# Patient Record
Sex: Male | Born: 1960
Health system: Southern US, Community
[De-identification: ages and names within clinical notes are randomized; demographics above are authoritative.]

## PROBLEM LIST (undated history)

## (undated) DIAGNOSIS — G219 Secondary parkinsonism, unspecified: Secondary | ICD-10-CM

## (undated) DIAGNOSIS — K5010 Crohn's disease of large intestine without complications: Secondary | ICD-10-CM

## (undated) DIAGNOSIS — F319 Bipolar disorder, unspecified: Secondary | ICD-10-CM

## (undated) DIAGNOSIS — G4700 Insomnia, unspecified: Secondary | ICD-10-CM

## (undated) DIAGNOSIS — N400 Enlarged prostate without lower urinary tract symptoms: Secondary | ICD-10-CM

## (undated) DIAGNOSIS — F99 Mental disorder, not otherwise specified: Secondary | ICD-10-CM

## (undated) DIAGNOSIS — R159 Full incontinence of feces: Secondary | ICD-10-CM

## (undated) DIAGNOSIS — I10 Essential (primary) hypertension: Secondary | ICD-10-CM

## (undated) DIAGNOSIS — E7800 Pure hypercholesterolemia, unspecified: Secondary | ICD-10-CM

## (undated) DIAGNOSIS — J42 Unspecified chronic bronchitis: Secondary | ICD-10-CM

## (undated) DIAGNOSIS — M810 Age-related osteoporosis without current pathological fracture: Secondary | ICD-10-CM

## (undated) DIAGNOSIS — F79 Unspecified intellectual disabilities: Secondary | ICD-10-CM

## (undated) DIAGNOSIS — F209 Schizophrenia, unspecified: Secondary | ICD-10-CM

## (undated) DIAGNOSIS — K219 Gastro-esophageal reflux disease without esophagitis: Secondary | ICD-10-CM

## (undated) DIAGNOSIS — F4310 Post-traumatic stress disorder, unspecified: Secondary | ICD-10-CM

## (undated) HISTORY — DX: Post-traumatic stress disorder, unspecified: F43.10

## (undated) HISTORY — DX: Benign prostatic hyperplasia without lower urinary tract symptoms: N40.0

## (undated) HISTORY — DX: Unspecified chronic bronchitis: J42

## (undated) HISTORY — DX: Schizophrenia, unspecified: F20.9

## (undated) HISTORY — DX: Insomnia, unspecified: G47.00

## (undated) HISTORY — DX: Age-related osteoporosis without current pathological fracture: M81.0

## (undated) HISTORY — PX: TRANSURETHRAL RESECTION OF PROSTATE: SHX73

## (undated) HISTORY — DX: Bipolar disorder, unspecified: F31.9

## (undated) HISTORY — DX: Full incontinence of feces: R15.9

## (undated) HISTORY — PX: TONSILLECTOMY AND ADENOIDECTOMY: SUR1326

---

## 2011-09-17 ENCOUNTER — Encounter (HOSPITAL_COMMUNITY): Admission: RE | Payer: Self-pay | Source: Ambulatory Visit

## 2011-09-17 ENCOUNTER — Ambulatory Visit (HOSPITAL_COMMUNITY): Admission: RE | Admit: 2011-09-17 | Payer: Self-pay | Source: Ambulatory Visit | Admitting: Oral Surgery

## 2011-09-17 SURGERY — DENTAL RESTORATION/EXTRACTIONS
Anesthesia: General | Laterality: Bilateral

## 2011-10-11 ENCOUNTER — Encounter (HOSPITAL_COMMUNITY): Payer: Self-pay | Admitting: Pharmacy Technician

## 2011-10-21 ENCOUNTER — Encounter (HOSPITAL_COMMUNITY): Payer: Self-pay

## 2011-10-21 ENCOUNTER — Encounter (HOSPITAL_COMMUNITY)
Admission: RE | Admit: 2011-10-21 | Discharge: 2011-10-21 | Disposition: A | Discharge status: Home / Self Care | Payer: Medicare Other | Source: Ambulatory Visit | Attending: Oral Surgery | Admitting: Oral Surgery

## 2011-10-21 ENCOUNTER — Other Ambulatory Visit (HOSPITAL_COMMUNITY): Payer: Self-pay | Admitting: *Deleted

## 2011-10-21 ENCOUNTER — Other Ambulatory Visit: Payer: Self-pay

## 2011-10-21 ENCOUNTER — Encounter (HOSPITAL_COMMUNITY)
Admission: RE | Admit: 2011-10-21 | Discharge: 2011-10-21 | Disposition: A | Discharge status: Home / Self Care | Payer: Medicare Other | Source: Ambulatory Visit | Attending: Anesthesiology | Admitting: Anesthesiology

## 2011-10-21 HISTORY — DX: Secondary parkinsonism, unspecified: G21.9

## 2011-10-21 HISTORY — DX: Pure hypercholesterolemia, unspecified: E78.00

## 2011-10-21 HISTORY — DX: Gastro-esophageal reflux disease without esophagitis: K21.9

## 2011-10-21 HISTORY — DX: Benign prostatic hyperplasia without lower urinary tract symptoms: N40.0

## 2011-10-21 HISTORY — DX: Crohn's disease of large intestine without complications: K50.10

## 2011-10-21 HISTORY — DX: Essential (primary) hypertension: I10

## 2011-10-21 HISTORY — DX: Unspecified intellectual disabilities: F79

## 2011-10-21 HISTORY — DX: Mental disorder, not otherwise specified: F99

## 2011-10-21 LAB — BASIC METABOLIC PANEL
BUN: 16 mg/dL (ref 6–23)
Calcium: 9.3 mg/dL (ref 8.4–10.5)
GFR calc Af Amer: >90 mL/min (ref >90)
GFR calc non Af Amer: >90 mL/min (ref >90)
Glucose, Bld: 81 mg/dL (ref 70–99)
Potassium: 4.2 mEq/L (ref 3.5–5.1)
Sodium: 141 mEq/L (ref 135–145)

## 2011-10-21 LAB — CBC
MCH: 32.3 pg (ref 26.0–34.0)
MCHC: 33.2 g/dL (ref 30.0–36.0)
Platelets: 199 10*3/uL (ref 150–400)

## 2011-10-21 NOTE — H&P (Signed)
HISTORY AND PHYSICAL  William [name] is a 51 y.o. male patient with CC: Painful teeth  No diagnosis found.  Past Medical History  Diagnosis Date  . GERD (gastroesophageal reflux disease)   . Mental disorder     bipolar  . Hypertension   . Mental retardation   . Prostate hypertrophy   . Crohn's colitis   . Asthma   . Hypercholesteremia   . Diabetes mellitus   . Parkinsonism, secondary     No current facility-administered medications for this encounter.   Current Outpatient Prescriptions  Medication Sig Dispense Refill  . aspirin EC 81 MG tablet Take 81 mg by mouth daily.      Marland Kitchen EPINEPHrine (EPI-PEN) 0.3 mg/0.3 mL DEVI Inject 0.3 mg into the muscle once. For allergic reaction to bees      . fluticasone (FLONASE) 50 MCG/ACT nasal spray Place 2 sprays into the nose daily.      Marland Kitchen lithium carbonate 300 MG capsule Take 300 mg by mouth 2 (two) times daily with a meal.      . LORazepam (ATIVAN) 1 MG tablet Take 1 mg by mouth 3 (three) times daily as needed. For agitation or anxiety      . medroxyPROGESTERone (DEPO-PROVERA) 150 MG/ML injection Inject 150 mg into the muscle once a week. On fridays      . metoprolol succinate (TOPROL-XL) 50 MG 24 hr tablet Take 50 mg by mouth daily. Take with or immediately following a meal.      . omeprazole (PRILOSEC) 20 MG capsule Take 20 mg by mouth 2 (two) times daily.      . risperiDONE (RISPERDAL) 1 MG/ML oral solution Take 1 mg by mouth at bedtime.       . Selenium Sulfide (SELSUN BLUE EX) Apply 1 application topically daily. Shampoo once a day      . simvastatin (ZOCOR) 10 MG tablet Take 10 mg by mouth at bedtime.      . Valproic Acid (DEPAKENE) 250 MG/5ML SYRP syrup Take 250 mg by mouth every morning.      . Valproic Acid (DEPAKENE) 250 MG/5ML SYRP syrup Take 1,750 mg by mouth at bedtime.       Allergies  Allergen Reactions  . Bee Anaphylaxis  . Latex Hives  . Penicillins Cross Reactors Hives and Rash  . Sulfa Drugs Cross Reactors Hives and  Rash   Active Problems:  * No active hospital problems. *   Vitals: There were no vitals taken for this visit. Lab results: Results for orders placed during the hospital encounter of 10/21/11 (from the past 24 hour(s))  BASIC METABOLIC PANEL     Status: Normal   Collection Time   10/21/11  1:41 PM      Component Value Range   Sodium 141  135 - 145 (mEq/L)   Potassium 4.2  3.5 - 5.1 (mEq/L)   Chloride 106  96 - 112 (mEq/L)   CO2 24  19 - 32 (mEq/L)   Glucose, Bld 81  70 - 99 (mg/dL)   BUN 16  6 - 23 (mg/dL)   Creatinine, Ser 4.54  0.50 - 1.35 (mg/dL)   Calcium 9.3  8.4 - 09.8 (mg/dL)   GFR calc non Af Amer >90  >90 (mL/min)   GFR calc Af Amer >90  >90 (mL/min)  CBC     Status: Abnormal   Collection Time   10/21/11  1:41 PM      Component Value Range   WBC  10.6 (*) 4.0 - 10.5 (K/uL)   RBC 3.96 (*) 4.22 - 5.81 (MIL/uL)   Hemoglobin 12.8 (*) 13.0 - 17.0 (g/dL)   HCT 78.2 (*) 95.6 - 52.0 (%)   MCV 97.2  78.0 - 100.0 (fL)   MCH 32.3  26.0 - 34.0 (pg)   MCHC 33.2  30.0 - 36.0 (g/dL)   RDW 21.3  08.6 - 57.8 (%)   Platelets 199  150 - 400 (K/uL)   Radiology Results: Dg Chest 2 View  10/21/2011  *RADIOLOGY REPORT*  Clinical Data: Preop tooth extraction  CHEST - 2 VIEW  Comparison: 09/08/2008  Findings: Heart size is normal.  There is no pleural effusion or edema noted.  There is no airspace consolidation identified.  Calcified pulmonary granulomas are identified in both lungs.  IMPRESSION:  1.  No acute cardiopulmonary abnormalities. 2.  Prior granulomatous inflammation/infection.  Original Report Authenticated By: Rosealee Albee, M.D.   General appearance: cooperative and slowed mentation Head: Normocephalic, without obvious abnormality, atraumatic Eyes: conjunctivae/corneas clear. PERRL, EOM's intact. Fundi benign. Ears: normal TM's and external ear canals both ears Nose: Nares normal. Septum midline. Mucosa normal. No drainage or sinus tenderness. Throat: dental caries, impacted  teeth Neck: no adenopathy, no carotid bruit, no JVD, supple, symmetrical, trachea midline and thyroid not enlarged, symmetric, no tenderness/mass/nodules Resp: clear to auscultation bilaterally Cardio: regular rate and rhythm, S1, S2 normal, no murmur, click, rub or gallop GI: soft, non-tender; bowel sounds normal; no masses,  no organomegaly  Assessment:50 YO WM        GERD (gastroesophageal reflux disease)   . Mental disorder     bipolar  . Hypertension   . Mental retardation   . Prostate hypertrophy   . Crohn's colitis   . Asthma   . Hypercholesteremia   . Diabetes mellitus   . Parkinsonism, secondary   With dental caries, impacted teeth #'s 1, 16, 17, 18, 31, 32. Plan: Dental extractions teeth #'s 1, 16, 17, 18, 31, and 32. General anesthesia. Day surgery.   Georgia Lopes 10/21/2011

## 2011-10-21 NOTE — Pre-Procedure Instructions (Signed)
20 William [name]  10/21/2011   Your procedure is scheduled on:10-22-2011 @10 :00 AM  Report to [facility] at 8:00 AM.  Call this number if you have problems the morning of surgery: [phone]   Remember:   Do not eat food:After Midnight.  May have clear liquids: up to 4 Hours before arrival.  Clear liquids include soda, tea, black coffee, apple or grape juice, broth.until 4:00 AM  Take these medicines the morning of surgery with A SIP OF WATER flonase,lithium,ativan if needed,metoprolol*,omeprazole,valproic acid,   Do not wear jewelry, make-up or nail polish.  Do not wear lotions, powders, or perfumes. You may wear deodorant.  Do not shave 48 hours prior to surgery.  Do not bring valuables to the hospital.  Contacts, dentures or bridgework may not be worn into surgery.  Leave suitcase in the car. After surgery it may be brought to your room.  For patients admitted to the hospital, checkout time is 11:00 AM the day of discharge.   Patients discharged the day of surgery will not be allowed to drive home.  Name and phone number of your driver:  Rayne Du  Special Instructions: CHG Shower Use Special Wash: 1/2 bottle night before surgery and 1/2 bottle morning of surgery.   Please read over the following fact sheets that you were given: Pain Booklet, MRSA Information and Surgical Site Infection Prevention

## 2011-10-22 ENCOUNTER — Encounter (HOSPITAL_COMMUNITY): Payer: Self-pay | Admitting: *Deleted

## 2011-10-22 ENCOUNTER — Ambulatory Visit (HOSPITAL_COMMUNITY)
Admission: RE | Admit: 2011-10-22 | Discharge: 2011-10-22 | Disposition: A | Discharge status: Home / Self Care | Payer: Medicare Other | Source: Ambulatory Visit | Attending: Oral Surgery | Admitting: Oral Surgery

## 2011-10-22 ENCOUNTER — Encounter (HOSPITAL_COMMUNITY): Payer: Self-pay | Admitting: Anesthesiology

## 2011-10-22 ENCOUNTER — Ambulatory Visit (HOSPITAL_COMMUNITY): Payer: Medicare Other | Admitting: Anesthesiology

## 2011-10-22 ENCOUNTER — Encounter (HOSPITAL_COMMUNITY)
Admission: RE | Disposition: A | Discharge status: Home / Self Care | Payer: Self-pay | Source: Ambulatory Visit | Attending: Oral Surgery

## 2011-10-22 DIAGNOSIS — J4489 Other specified chronic obstructive pulmonary disease: Secondary | ICD-10-CM | POA: Insufficient documentation

## 2011-10-22 DIAGNOSIS — Z01812 Encounter for preprocedural laboratory examination: Secondary | ICD-10-CM | POA: Insufficient documentation

## 2011-10-22 DIAGNOSIS — J449 Chronic obstructive pulmonary disease, unspecified: Secondary | ICD-10-CM | POA: Insufficient documentation

## 2011-10-22 DIAGNOSIS — I10 Essential (primary) hypertension: Secondary | ICD-10-CM | POA: Insufficient documentation

## 2011-10-22 DIAGNOSIS — E119 Type 2 diabetes mellitus without complications: Secondary | ICD-10-CM | POA: Insufficient documentation

## 2011-10-22 DIAGNOSIS — K029 Dental caries, unspecified: Secondary | ICD-10-CM | POA: Insufficient documentation

## 2011-10-22 DIAGNOSIS — Z01810 Encounter for preprocedural cardiovascular examination: Secondary | ICD-10-CM | POA: Insufficient documentation

## 2011-10-22 DIAGNOSIS — Z01818 Encounter for other preprocedural examination: Secondary | ICD-10-CM | POA: Insufficient documentation

## 2011-10-22 DIAGNOSIS — K219 Gastro-esophageal reflux disease without esophagitis: Secondary | ICD-10-CM | POA: Insufficient documentation

## 2011-10-22 HISTORY — PX: TOOTH EXTRACTION: SHX859

## 2011-10-22 LAB — GLUCOSE, CAPILLARY: Glucose-Capillary: 97 mg/dL (ref 70–99)

## 2011-10-22 SURGERY — DENTAL RESTORATION/EXTRACTIONS
Anesthesia: General | Site: Mouth | Laterality: Bilateral | Wound class: Clean Contaminated

## 2011-10-22 MED ORDER — GLYCOPYRROLATE 0.2 MG/ML IJ SOLN
INTRAMUSCULAR | Status: DC | PRN
  Administered 2011-10-22: .8 mg via INTRAVENOUS

## 2011-10-22 MED ORDER — PROPOFOL 10 MG/ML IV EMUL
INTRAVENOUS | Status: DC | PRN
  Administered 2011-10-22: 200 mg via INTRAVENOUS

## 2011-10-22 MED ORDER — LACTATED RINGERS IV SOLN
INTRAVENOUS | Status: DC | PRN
  Administered 2011-10-22 (×2): via INTRAVENOUS

## 2011-10-22 MED ORDER — FENTANYL CITRATE 0.05 MG/ML IJ SOLN
INTRAMUSCULAR | Status: DC | PRN
  Administered 2011-10-22: 150 ug via INTRAVENOUS

## 2011-10-22 MED ORDER — ROCURONIUM BROMIDE 100 MG/10ML IV SOLN
INTRAVENOUS | Status: DC | PRN
  Administered 2011-10-22: 50 mg via INTRAVENOUS

## 2011-10-22 MED ORDER — LIDOCAINE-EPINEPHRINE 2 %-1:100000 IJ SOLN
INTRAMUSCULAR | Status: DC | PRN
  Administered 2011-10-22: 10 mL

## 2011-10-22 MED ORDER — ONDANSETRON HCL 4 MG/2ML IJ SOLN
INTRAMUSCULAR | Status: DC | PRN
  Administered 2011-10-22: 4 mg via INTRAVENOUS

## 2011-10-22 MED ORDER — PROMETHAZINE HCL 25 MG/ML IJ SOLN: 6.2500 mg | INTRAMUSCULAR | Status: DC | PRN

## 2011-10-22 MED ORDER — MIDAZOLAM HCL 5 MG/5ML IJ SOLN
INTRAMUSCULAR | Status: DC | PRN
  Administered 2011-10-22: 2 mg via INTRAVENOUS

## 2011-10-22 MED ORDER — LACTATED RINGERS IV SOLN
INTRAVENOUS | Status: DC
  Administered 2011-10-22: 10:00:00 via INTRAVENOUS

## 2011-10-22 MED ORDER — HYDROMORPHONE HCL PF 1 MG/ML IJ SOLN
0.2500 mg | INTRAMUSCULAR | Status: DC | PRN
  Administered 2011-10-22: 0.5 mg via INTRAVENOUS

## 2011-10-22 MED ORDER — SODIUM CHLORIDE 0.9 % IR SOLN
Status: DC | PRN
  Administered 2011-10-22: 1000 mL

## 2011-10-22 MED ORDER — MEPERIDINE HCL 25 MG/ML IJ SOLN: 6.2500 mg | INTRAMUSCULAR | Status: DC | PRN

## 2011-10-22 MED ORDER — DEXTROSE 5 % IV SOLN
INTRAVENOUS | Status: AC
  Filled 2011-10-22: qty 50

## 2011-10-22 MED ORDER — CLINDAMYCIN PHOSPHATE 600 MG/4ML IJ SOLN
INTRAMUSCULAR | Status: AC
  Filled 2011-10-22: qty 4

## 2011-10-22 MED ORDER — CLINDAMYCIN PHOSPHATE 600 MG/50ML IV SOLN
INTRAVENOUS | Status: DC | PRN
  Administered 2011-10-22: 600 mg via INTRAVENOUS

## 2011-10-22 MED ORDER — MORPHINE SULFATE 2 MG/ML IJ SOLN: 0.0500 mg/kg | INTRAMUSCULAR | Status: DC | PRN

## 2011-10-22 MED ORDER — NEOSTIGMINE METHYLSULFATE 1 MG/ML IJ SOLN
INTRAMUSCULAR | Status: DC | PRN
  Administered 2011-10-22: 5 mg via INTRAVENOUS

## 2011-10-22 MED ORDER — OXYCODONE-ACETAMINOPHEN 5-325 MG PO TABS: 1.0000 | ORAL_TABLET | ORAL | Status: AC | PRN

## 2011-10-22 SURGICAL SUPPLY — 38 items
BLADE SURG 15 STRL LF DISP TIS (BLADE) IMPLANT
BLADE SURG 15 STRL SS (BLADE)
BUR CROSS CUT (BURR)
BUR CROSS CUT FISSURE 1.6 (BURR) IMPLANT
BUR EGG ELITE 4.0 (BURR) IMPLANT
BUR SRG MED 1.6XXCUT FSSR (BURR) IMPLANT
BUR SURG 4X8 MED (BURR) IMPLANT
BURR SRG MED 1.6XXCUT FSSR (BURR)
BURR SURG 4X8 MED (BURR)
CANISTER SUCTION 2500CC (MISCELLANEOUS) ×2 IMPLANT
CLOTH BEACON ORANGE TIMEOUT ST (SAFETY) ×2 IMPLANT
COVER SURGICAL LIGHT HANDLE (MISCELLANEOUS) ×2 IMPLANT
GAUZE PACKING FOLDED 2  STR (GAUZE/BANDAGES/DRESSINGS) ×1
GAUZE PACKING FOLDED 2 STR (GAUZE/BANDAGES/DRESSINGS) ×1 IMPLANT
GAUZE SPONGE 4X4 16PLY XRAY LF (GAUZE/BANDAGES/DRESSINGS) IMPLANT
GLOVE BIO SURGEON STRL SZ 6.5 (GLOVE) IMPLANT
GLOVE BIO SURGEON STRL SZ7 (GLOVE) IMPLANT
GLOVE BIO SURGEON STRL SZ7.5 (GLOVE) IMPLANT
GLOVE BIOGEL PI IND STRL 7.0 (GLOVE) ×1 IMPLANT
GLOVE BIOGEL PI INDICATOR 7.0 (GLOVE) ×1
GLOVE SS N UNI LF 6.5 STRL (GLOVE) ×4 IMPLANT
GLOVE SS N UNI LF 7.5 STRL (GLOVE) ×2 IMPLANT
GOWN STRL NON-REIN LRG LVL3 (GOWN DISPOSABLE) ×4 IMPLANT
GOWN STRL REIN XL XLG (GOWN DISPOSABLE) ×2 IMPLANT
KIT BASIN OR (CUSTOM PROCEDURE TRAY) ×2 IMPLANT
KIT ROOM TURNOVER OR (KITS) ×2 IMPLANT
NEEDLE 22X1 1/2 (OR ONLY) (NEEDLE) ×2 IMPLANT
NEEDLE BLUNT 16X1.5 OR ONLY (NEEDLE) IMPLANT
NS IRRIG 1000ML POUR BTL (IV SOLUTION) ×2 IMPLANT
PAD ARMBOARD 7.5X6 YLW CONV (MISCELLANEOUS) ×4 IMPLANT
SUT CHROMIC 3 0 PS 2 (SUTURE) ×4 IMPLANT
SYR 50ML SLIP (SYRINGE) IMPLANT
TOWEL OR 17X24 6PK STRL BLUE (TOWEL DISPOSABLE) IMPLANT
TOWEL OR 17X26 10 PK STRL BLUE (TOWEL DISPOSABLE) ×2 IMPLANT
TRAY ENT MC OR (CUSTOM PROCEDURE TRAY) ×2 IMPLANT
TUBING IRRIGATION (MISCELLANEOUS) ×2 IMPLANT
WATER STERILE IRR 1000ML POUR (IV SOLUTION) IMPLANT
YANKAUER SUCT BULB TIP NO VENT (SUCTIONS) ×2 IMPLANT

## 2011-10-22 NOTE — Preoperative (Signed)
Beta Blockers   Reason not to administer Beta Blockers:Not Applicable 

## 2011-10-22 NOTE — H&P (Signed)
H&P documentation  -History and Physical Reviewed  -Patient has been re-examined  -No change in the plan of care  William [name] M  

## 2011-10-22 NOTE — Op Note (Signed)
10/22/2011  10:41 AM  PATIENT:  William [name]  51 y.o. male  PRE-OPERATIVE DIAGNOSIS:  Impacted nonrestorable teeth numberst one, sixteen, seventeen, eighteen, thirty one, and thirty two.  POST-OPERATIVE DIAGNOSIS:  SAME + previously extracted teeth #'s 16, 32  PROCEDURE:  Procedure(s): Extraction teeth 1, 17, 18, 31   SURGEON:  Surgeon(s): Georgia Lopes, DDS  ANESTHESIA:   local and general  EBL:  minimal  DRAINS: none   LOCAL MEDICATIONS USED:  LIDOCAINE 10 CC  SPECIMEN:  No Specimen  COUNTS:  YES  PLAN OF CARE: Discharge to home after PACU  PATIENT DISPOSITION:  PACU - hemodynamically stable.   PROCEDURE DETAILS: Dictation #[id number]  Georgia Lopes, DMD 10/22/2011 10:41 AM

## 2011-10-22 NOTE — Transfer of Care (Signed)
Immediate Anesthesia Transfer of Care Note  Patient: William [name]  Procedure(s) Performed: Procedure(s) (LRB): DENTAL RESTORATION/EXTRACTIONS (Bilateral)  Patient Location: PACU  Anesthesia Type: General  Level of Consciousness: awake and alert   Airway & Oxygen Therapy: Patient Spontanous Breathing and Patient connected to face mask oxygen  Post-op Assessment: Report given to PACU RN  Post vital signs: Reviewed and stable  Complications: No apparent anesthesia complications

## 2011-10-22 NOTE — Anesthesia Postprocedure Evaluation (Signed)
  Anesthesia Post-op Note  Patient: William [name]  Procedure(s) Performed: Procedure(s) (LRB): DENTAL RESTORATION/EXTRACTIONS (Bilateral)  Patient Location: PACU  Anesthesia Type: General  Level of Consciousness: awake  Airway and Oxygen Therapy: Patient Spontanous Breathing  Post-op Pain: mild  Post-op Assessment: Post-op Vital signs reviewed  Post-op Vital Signs: stable  Complications: No apparent anesthesia complications

## 2011-10-22 NOTE — Anesthesia Postprocedure Evaluation (Signed)
  Anesthesia Post-op Note  Patient: William [name]  Procedure(s) Performed: Procedure(s) (LRB): DENTAL RESTORATION/EXTRACTIONS (Bilateral)  Patient Location: PACU  Anesthesia Type: General  Level of Consciousness: awake  Airway and Oxygen Therapy: Patient Spontanous Breathing  Post-op Pain: mild  Post-op Assessment: Post-op Vital signs reviewed  Post-op Vital Signs: stable  Complications: No apparent anesthesia complications 

## 2011-10-22 NOTE — Anesthesia Preprocedure Evaluation (Signed)
Anesthesia Evaluation  Patient identified by MRN, date of birth, ID band Patient awake    Reviewed: Allergy & Precautions, NPO status , Patient's Chart, lab work & pertinent test results  Airway Mallampati: II      Dental   Pulmonary asthma ,          Cardiovascular hypertension,     Neuro/Psych    GI/Hepatic Neg liver ROS, GERD-  ,  Endo/Other    Renal/GU      Musculoskeletal   Abdominal   Peds  Hematology negative hematology ROS (+)   Anesthesia Other Findings   Reproductive/Obstetrics                           Anesthesia Physical Anesthesia Plan  ASA: III  Anesthesia Plan: General   Post-op Pain Management:    Induction:   Airway Management Planned: Nasal ETT  Additional Equipment:   Intra-op Plan:   Post-operative Plan: Extubation in OR  Informed Consent: I have reviewed the patients History and Physical, chart, labs and discussed the procedure including the risks, benefits and alternatives for the proposed anesthesia with the patient or authorized representative who has indicated his/her understanding and acceptance.     Plan Discussed with: CRNA  Anesthesia Plan Comments:         Anesthesia Quick Evaluation

## 2011-10-23 ENCOUNTER — Encounter (HOSPITAL_COMMUNITY): Payer: Self-pay | Admitting: Oral Surgery

## 2011-10-23 NOTE — Op Note (Signed)
William, [name] NO.:  [id number]  MEDICAL RECORD NO.:  [id number]  LOCATION:  [facility]                         FACILITY:  [facility]  PHYSICIAN:  Georgia Lopes, M.D.  DATE OF BIRTH:  1961/07/04  DATE OF PROCEDURE: DATE OF DISCHARGE:  10/22/2011                              OPERATIVE REPORT   PREOPERATIVE DIAGNOSIS:  Nonrestorable teeth, numbers 1, 16, 17, 18, 31, 32.  POSTOPERATIVE DIAGNOSES:  Nonrestorable teeth, numbers 1, 16, 17, 18, 31, 32 plus previously extracted teeth, numbers 16 and 32.  PROCEDURE:  Extraction of teeth, numbers 1, 17,18, and 31.  SURGEON:  Georgia Lopes, MD  ANESTHESIA:  General.  Dr. Katrinka Blazing, attending, nasal intubation.  INDICATIONS FOR PROCEDURE:  William [name] is a 51 year old male who is referred to my office by his general dentist for removal of nonrestorable teeth.  He has significant medical history and complications including COPD, history of seizures, and severe psychiatric disorder.  Because of the patient's perceived inability to tolerate the procedure with local anesthesia, it was recommended that the patient would be anesthetized and intubated for airway protection.  PROCEDURE:  The patient was taken to the operating room and placed on the table in supine position.  General anesthesia was administered intravenously and a nasal endotracheal tube was placed and secured.  The eyes were protected.  The patient was draped for the procedure.  Time- out was performed.  Throat pack was placed after suctioning the pharynx. 2% lidocaine with 1:100,000 epinephrine was infiltrated in an inferior- alveolar block on the right and left side and a buccal and palatal infiltration in the maxilla in the area of teeth numbers 1, 16.  A bite block was placed in the right side of the mouth and a sweetheart retractor was used to retract the tongue.  A 15 blade used to make a full-thickness incision around teeth numbers 17, 18 and overlying  the area where tooth number 16 appeared to be.  The periosteum was reflected with a periosteal elevator.  Bone was removed around teeth numbers 17 and 18, and these teeth were removed with the 301 elevator and dental forceps.  Distal root fracture on tooth number 18 necessitating additional bone removal, but the root tip was removed with the root tip pick.  In the area of number 16, there was healing bone, but no root or root fragment was noted.  Then, the areas were irrigated and closed with 3-0 chromic.  The bite block and sweetheart were replaced to the other side of the mouth, and attention was turned to tooth number 1.  The area was examined and a 15 blade was used to make an incision around tooth number 1 and area of teeth numbers 31 and 32.  A full-thickness incision was created around tooth number 31 with a distal extension.  The periosteum was reflected and the bone was inspected and there was no tooth number 32 present as it apparently had already been previously extracted.  The radiograph, which was available was taken in 2010 showed the extraction mostly there since then.  Then, the bone was removed around teeth numbers 1 and 31 with the handpiece.  Then,  the teeth were elevated and removed with the forceps.  The sockets were curetted, irrigated, and closed with 3-0 chromic.  The oral cavity was then inspected and found to have good hemostasis and closure.  The oral cavity was suctioned.  Throat pack was removed.  The patient was awakened and taken to the recovery room breathing spontaneously in good condition.  EBL:  Minimum.  COMPLICATIONS:  None.  SPECIMENS:  None.     Georgia Lopes, M.D.     SMJ/MEDQ  D:  10/22/2011  T:  10/23/2011  Job:  [id number]

## 2015-01-18 ENCOUNTER — Encounter: Payer: Self-pay | Admitting: *Deleted

## 2015-01-19 ENCOUNTER — Ambulatory Visit (INDEPENDENT_AMBULATORY_CARE_PROVIDER_SITE_OTHER): Payer: Medicare Other | Admitting: Neurology

## 2015-01-19 ENCOUNTER — Encounter: Payer: Self-pay | Admitting: *Deleted

## 2015-01-19 VITALS — BP 125/70 | HR 75 | Ht 72.0 in | Wt 158.6 lb

## 2015-01-19 DIAGNOSIS — R482 Apraxia: Secondary | ICD-10-CM

## 2015-01-19 NOTE — Patient Instructions (Addendum)
I had a long discussion with the patient's caregiver [name] and the patient regarding his difficulty with walking. 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 He may benefit with trial of low-dose Sinemet. Check CT scan of the head for structural brain lesions as well as check lab work for CMP, CBC, TSH, valproic acid levels. Continue ongoing physical therapy for gait and balance. Return for follow-up in 2 months or call earlier if necessary. Fall Prevention and Home Safety Falls cause injuries and can affect all age groups. It is possible to use preventive measures to significantly decrease the likelihood of falls. There are many simple measures which can make your home safer and prevent falls. OUTDOORS  Repair cracks and edges of walkways and driveways.  Remove high doorway thresholds.  Trim shrubbery on the main path into your home.  Have good outside lighting.  Clear walkways of tools, rocks, debris, and clutter.  Check that handrails are not broken and are securely fastened. Both sides of steps should have handrails.  Have leaves, snow, and ice cleared regularly.  Use sand or salt on walkways during winter months.  In the garage, clean up grease or oil spills. BATHROOM  Install night lights.  Install grab bars by the toilet and in the tub and shower.  Use non-skid mats or decals in the tub or shower.  Place a plastic non-slip stool in the shower to sit on, if needed.  Keep floors dry and clean up all water on the floor immediately.  Remove soap buildup in the tub or shower on a regular basis.  Secure bath mats with non-slip, double-sided rug tape.  Remove throw rugs and tripping hazards from the floors. BEDROOMS  Install night lights.  Make sure a bedside light is easy to reach.  Do not use oversized bedding.  Keep a telephone by your bedside.  Have a firm chair with side arms to use for getting dressed.  Remove throw rugs  and tripping hazards from the floor. KITCHEN  Keep handles on pots and pans turned toward the center of the stove. Use back burners when possible.  Clean up spills quickly and allow time for drying.  Avoid walking on wet floors.  Avoid hot utensils and knives.  Position shelves so they are not too high or low.  Place commonly used objects within easy reach.  If necessary, use a sturdy step stool with a grab bar when reaching.  Keep electrical cables out of the way.  Do not use floor polish or wax that makes floors slippery. If you must use wax, use non-skid floor wax.  Remove throw rugs and tripping hazards from the floor. STAIRWAYS  Never leave objects on stairs.  Place handrails on both sides of stairways and use them. Fix any loose handrails. Make sure handrails on both sides of the stairways are as long as the stairs.  Check carpeting to make sure it is firmly attached along stairs. Make repairs to worn or loose carpet promptly.  Avoid placing throw rugs at the top or bottom of stairways, or properly secure the rug with carpet tape to prevent slippage. Get rid of throw rugs, if possible.  Have an electrician put in a light switch at the top and bottom of the stairs. OTHER FALL PREVENTION TIPS  Wear low-heel or rubber-soled shoes that are supportive and fit well. Wear closed toe shoes.  When using a stepladder, make sure it is fully opened and both spreaders are firmly  locked. Do not climb a closed stepladder.  Add color or contrast paint or tape to grab bars and handrails in your home. Place contrasting color strips on first and last steps.  Learn and use mobility aids as needed. Install an electrical emergency response system.  Turn on lights to avoid dark areas. Replace light bulbs that burn out immediately. Get light switches that glow.  Arrange furniture to create clear pathways. Keep furniture in the same place.  Firmly attach carpet with non-skid or  double-sided tape.  Eliminate uneven floor surfaces.  Select a carpet pattern that does not visually hide the edge of steps.  Be aware of all pets. OTHER HOME SAFETY TIPS  Set the water temperature for 120 F (48.8 C).  Keep emergency numbers on or near the telephone.  Keep smoke detectors on every level of the home and near sleeping areas. Document Released: 08/16/2002 Document Revised: 02/25/2012 Document Reviewed: 11/15/2011 [facility] Patient Information 2015 [employer], [place]. This information is not intended to replace advice given to you by your health care provider. Make sure you discuss any questions you have with your health care provider.

## 2015-01-19 NOTE — Progress Notes (Signed)
[facility] [address] [place]. [place] [id number] [phone]       OFFICE CONSULT NOTE  Mr. William [name] Date of Birth:  05/18/1961 Medical Record Number:  [id number]   Referring MD:  Brent BullaLawrence Perry  Reason for Referral:  Parkinson's disease and gait difficulty  HPI: Mr William [name] is a [age] year Caucasian male with lifelong history of mental retardation's, psychosis and behavioral abnormalities. He is unable to provide history which is obtained from his caregiver [place] who accompanies him. He apparently has had long-standing mild shuffling gait difficulties but for the last month or so his walking has declined significantly. There are times when he freezes and is unable to walk. On some occasions he may be able to walk after using tricks like asking him to step over something but on other occasions he may walk sideways but not forwards. The caregiver showed me her cell phone video of the patient shuffling and walking with short steps and all of the sudden he stops walking and goes down to his knees and he is unable to walk even after being prompted to do so. He requires full assist and lives in a foster home and has 24-hour supervision. 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 He is not had any recent falls or injuries. He has been on long term antipsychotics for most of his life but currently he is taking only Risperdal 1 mg at night in addition to the TM and Depakote. He has had some agitation and behavior issues and hallucinations but these seem to be fairly well controlled at the present time. There has been no excessive drooling of saliva noted. He has also had long-standing tremors in his hands but but they are both absent at rest and are likely medication related. ROS:   14 system review of systems is positive for   fatigue, diarrhea, constipation, urination problems, incontinence, cramps, allergies, runny nose, confusion, and numbness, slurred speech, insomnia, sleepiness, tremor, anxiety, appetite change, disinterest in activities, hallucinations.  PMH:  Past Medical History  Diagnosis Date  . GERD (gastroesophageal reflux disease)   . Mental disorder     bipolar  . Hypertension   . Mental retardation   . Prostate hypertrophy   . Crohn's colitis   . Asthma   . Hypercholesteremia   . Diabetes mellitus     Type II  . Parkinsonism, secondary   . Schizophrenia, chronic condition   . Osteoporosis   . Incontinence of feces   . Bipolar affective disorder     chronic  . BPH (benign prostatic hyperplasia)   . Chronic bronchitis   . Insomnia   . PTSD (post-traumatic stress disorder)     Social History:  History   Social History  . Marital Status: Single    Spouse Name: N/A  . Number of Children: N/A  . Years of Education: N/A   Occupational History  . disabled    Social History Main Topics  . Smoking status: Never Smoker   . Smokeless tobacco: Not on file  . Alcohol Use: No  . Drug Use: No  . Sexual Activity: Not on file   Other Topics Concern  . Not on file   Social History Narrative   Single, disabled, lives in home with full time caregiver- [name]   Right handed  caffeine use - none    Medications:   Current Outpatient Prescriptions on File Prior to Visit  Medication Sig Dispense Refill  . aspirin EC 81 MG tablet Take 81 mg by mouth daily.    Marland Kitchen. EPINEPHrine (EPI-PEN) 0.3 mg/0.3 mL DEVI Inject 0.3 mg into the muscle once. For allergic reaction to bees    . fluticasone (FLONASE) 50 MCG/ACT nasal spray Place 2 sprays into the nose daily.    Marland Kitchen. lithium carbonate 300 MG capsule Take 300 mg by mouth 2 (two) times daily with a meal.    . LORazepam (ATIVAN) 1 MG tablet Take 1 mg by mouth 3 (three) times daily as needed. For agitation or anxiety    . medroxyPROGESTERone  (DEPO-PROVERA) 150 MG/ML injection Inject 150 mg into the muscle once a week. On fridays    . metoprolol succinate (TOPROL-XL) 50 MG 24 hr tablet Take 50 mg by mouth daily. Take with or immediately following a meal.    . omeprazole (PRILOSEC) 20 MG capsule Take 20 mg by mouth 2 (two) times daily.    . risperiDONE (RISPERDAL) 1 MG/ML oral solution Take 1 mg by mouth at bedtime.     . Selenium Sulfide (SELSUN BLUE EX) Apply 1 application topically daily. Shampoo once a day    . simvastatin (ZOCOR) 10 MG tablet Take 10 mg by mouth at bedtime.    . Valproic Acid (DEPAKENE) 250 MG/5ML SYRP syrup Take 250 mg by mouth every morning.    . Valproic Acid (DEPAKENE) 250 MG/5ML SYRP syrup Take 1,750 mg by mouth at bedtime.     No current facility-administered medications on file prior to visit.    Allergies:   Allergies  Allergen Reactions  . Bee Venom Anaphylaxis  . Nutritional Supplements Anaphylaxis    Care giver unsure if this is correct  . Latex Hives  . Penicillins Cross Reactors Hives and Rash  . Sulfa Drugs Cross Reactors Hives and Rash    Physical Exam General: Frail cachectic looking middle-age Caucasian male, seated, in no evident distress Head: head normocephalic and atraumatic.   Neck: supple with no carotid or supraclavicular bruits Cardiovascular: regular rate and rhythm, no murmurs Musculoskeletal: no deformity Skin:  no rash/petichiae Vascular:  Normal pulses all extremities  Neurologic Exam Mental Status: Awake and fully alert. Oriented to place and person only. Recent and remote memory poor. Attention span, concentration and fund of knowledge diminished Mood and affect appropriate. Mini-Mental status exam score 14/30 with deficits in orientation, attention, calculation and recall and following 3 step commands. Animal naming test 5 only. Unable to copy intersecting pentagons or draw a clock. Geriatric depression scale score 5 which is borderline for depression. Speech is  pressured and at times dysarthric and difficult to understand but can speak fluent sentences if he can slow down Cranial Nerves: Fundoscopic exam reveals sharp disc margins. Pupils equal, briskly reactive to light. Extraocular movements full without nystagmus. Visual fields full to confrontation. Hearing intact. Facial sensation intact. Face, tongue, palate moves normally and symmetrically.  Motor: Normal bulk and tone. Normal strength in all tested extremity muscles. No visible orofacial dyskinesias. No resting tremor but mild fine action tremor of outstretched upper extremities. Mild cogwheel rigidity at both wrists upon activation only. Able to get up from the chair with arms folded across his chest. Sensory.: intact to touch , pinprick , position and vibratory sensation.  Coordination: Rapid alternating movements normal in all extremities. Finger-to-nose and heel-to-shin performed accurately bilaterally. Gait and Station:  Arises from chair without difficulty. Stance is normal. Gait short steps with initial shuffling and initiation apraxia and some festination but no stooped posture. Able to heel, toe and tandem walk without difficulty.  Reflexes: 1+ and symmetric. Toes downgoing.      ASSESSMENT: 56 year Caucasian male with a lifelong history of mental retardation and behavioral psychosis with drug-induced Parkinsonism with recent worsening of gait with freezing episodes and gait apraxia. Bilateral upper extremity tremor likely multifactorial from drug effect    PLAN: I had a long discussion with the patient's caregiver [place] and the patient regarding his difficulty with walking. 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 He may benefit with trial of low-dose Sinemet. Check CT scan of the head for structural brain lesions as well as check lab work for CMP, CBC, TSH, valproic acid levels. Continue ongoing physical therapy for gait and balance. Return for  follow-up in 2 months or call earlier if necessary.  Delia Heady, MD Note: This document was prepared with digital dictation and possible smart phrase technology. Any transcriptional errors that result from this process are unintentional.

## 2015-01-20 ENCOUNTER — Telehealth: Payer: Self-pay | Admitting: *Deleted

## 2015-01-20 LAB — COMPREHENSIVE METABOLIC PANEL
ALK PHOS: 34 IU/L — AB (ref 39–117)
ALT: 22 IU/L (ref 0–44)
AST: 17 IU/L (ref 0–40)
Albumin/Globulin Ratio: 1.8 (ref 1.1–2.5)
Albumin: 4.1 g/dL (ref 3.5–5.5)
BUN / CREAT RATIO: 16 (ref 9–20)
BUN: 15 mg/dL (ref 6–24)
Bilirubin Total: 0.4 mg/dL (ref 0.0–1.2)
CALCIUM: 8.7 mg/dL (ref 8.7–10.2)
CHLORIDE: 106 mmol/L (ref 97–108)
CO2: 19 mmol/L (ref 18–29)
CREATININE: 0.93 mg/dL (ref 0.76–1.27)
GFR calc Af Amer: 108 mL/min/{1.73_m2} (ref >59)
GFR, EST NON AFRICAN AMERICAN: 93 mL/min/{1.73_m2} (ref >59)
Globulin, Total: 2.3 g/dL (ref 1.5–4.5)
Glucose: 84 mg/dL (ref 65–99)
Potassium: 4.2 mmol/L (ref 3.5–5.2)
Sodium: 143 mmol/L (ref 134–144)
TOTAL PROTEIN: 6.4 g/dL (ref 6.0–8.5)

## 2015-01-20 LAB — VALPROIC ACID LEVEL: Valproic Acid Lvl: 72 ug/mL (ref 50–100)

## 2015-01-20 LAB — CBC
Hematocrit: 37.2 % — ABNORMAL LOW (ref 37.5–51.0)
Hemoglobin: 12 g/dL — ABNORMAL LOW (ref 12.6–17.7)
MCH: 32 pg (ref 26.6–33.0)
MCHC: 32.3 g/dL (ref 31.5–35.7)
MCV: 99 fL — ABNORMAL HIGH (ref 79–97)
PLATELETS: 178 10*3/uL (ref 150–379)
RBC: 3.75 x10E6/uL — ABNORMAL LOW (ref 4.14–5.80)
RDW: 14.1 % (ref 12.3–15.4)
WBC: 5.6 10*3/uL (ref 3.4–10.8)

## 2015-01-20 LAB — TSH: TSH: 2.6 u[IU]/mL (ref 0.450–4.500)

## 2015-01-20 NOTE — Telephone Encounter (Signed)
-----   Message from Micki RileyPramod S Sethi, MD sent at 01/20/2015  6:20 AM EDT ----- Kindly inform patient`s caregiver that all labs unremarkable and valproic acid level is normal also

## 2015-01-20 NOTE — Telephone Encounter (Signed)
Per Dr Pearlean BrownieSethi, spoke to [place], patient's caregiver and informed her that all his labs are "unremarkable". Confirmed with her his 2 month FU date/time. She requested lab results be faxed to her; will fax today.

## 2015-01-23 ENCOUNTER — Telehealth: Payer: Self-pay | Admitting: Neurology

## 2015-01-23 NOTE — Telephone Encounter (Signed)
lvm to give mri appointment to patient caregiver.  [facility] is 11:30 at [facility] arrive at 11:00am.  [phone] [id number] [facility]

## 2015-01-23 NOTE — Telephone Encounter (Signed)
Patient's caregiver [name] is calling and states that the order for patient to have catskan was [facility] but they would like to have the test at [facility].  Thanks!

## 2015-02-01 ENCOUNTER — Telehealth: Payer: Self-pay | Admitting: Neurology

## 2015-02-01 NOTE — Telephone Encounter (Signed)
William PonsHolly Garren a caregiver for the patient is calling to get CT results for the patient.

## 2015-02-01 NOTE — Telephone Encounter (Signed)
Per Dr Pearlean BrownieSethi, spoke with [facility] and gave her MRI results. She requests the results be faxed to her;

## 2015-02-01 NOTE — Telephone Encounter (Signed)
Tried to reach [place], patient's caregiver to inquire if [facility] has faxed MRI results to this office. Unable to leave message for her.

## 2015-02-02 NOTE — Telephone Encounter (Signed)
Yes Looked at it y`day and d/w you

## 2015-04-12 ENCOUNTER — Encounter: Payer: Self-pay | Admitting: Neurology

## 2015-04-12 ENCOUNTER — Ambulatory Visit (INDEPENDENT_AMBULATORY_CARE_PROVIDER_SITE_OTHER): Payer: Medicare Other | Admitting: Neurology

## 2015-04-12 VITALS — BP 123/77 | HR 58 | Ht 72.0 in | Wt 156.8 lb

## 2015-04-12 DIAGNOSIS — R269 Unspecified abnormalities of gait and mobility: Secondary | ICD-10-CM | POA: Diagnosis not present

## 2015-04-12 NOTE — Progress Notes (Signed)
[facility] [address] [place]. [place] [address] [phone]       OFFICE CONSULT NOTE  Mr. AZHAR KNOPE Date of Birth:  1961-05-04 Medical Record Number:  [id number]   Referring MD:  Brent Bulla  Reason for Referral:  Parkinson's disease and gait difficulty  HPI: Mr Pasha is a 39 year Caucasian male with lifelong history of mental retardation's, psychosis and behavioral abnormalities. He is unable to provide history which is obtained from his caregiver [place] who accompanies him. He apparently has had long-standing mild shuffling gait difficulties but for the last month or so his walking has declined significantly. There are times when he freezes and is unable to walk. On some occasions he may be able to walk after using tricks like asking him to step over something but on other occasions he may walk sideways but not forwards. The caregiver showed me her cell phone video of the patient shuffling and walking with short steps and all of the sudden he stops walking and goes down to his knees and he is unable to walk even after being prompted to do so. He requires full assist and lives in a foster home and has 24-hour supervision. 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 He has been on long term antipsychotics for most of his life but currently he is taking only Risperdal 1 mg at night in addition to the TM and Depakote. He has had some agitation and behavior issues and hallucinations but these seem to be fairly well controlled at the present time. There has been no excessive drooling of saliva noted. He has also had long-standing tremors in his hands but but they are both absent at rest and are likely medication related.   Update 04/12/2015 : He returns for follow-up after  last visit 3 months ago. He is accompanied by his caregiver [place]. She feels that the patient has noticed improvement in his walking and freezing after starting Sinemet which is included tolerating well in the present dose of 25/100 one tablet 3 times daily. She feels that the times his gait difficulties is attention seeking behavior as at times he can walk fairly well and at times he has bizarre walking which taking of his legs. Patient was recently hospitalized with acute psychotic episode of month ago. He had a CT scan of the head which had ordered at last visit at [facility] on 01/24/15 which showed mild generalized atrophy and no acute findings. Lab work done on 01/19/15 which I reviewed showed normal TSH, complete metabolic panel and CBC. Patient continues to live in a foster home and his medications and supervising given to him. His tremor seemed to be improved and he is on Sinemet and he has discontinued Artane. Review of Systems : Positive for runny nose, insomnia, frequent waking, muscle cramps, walking difficulty, numbness, agitation, near vomiting, decreased concentration, anxiety, nervousness, and 7 additions and self injury. All other systems negative PMH:  Past Medical History  Diagnosis Date  . GERD (gastroesophageal reflux disease)   . Mental disorder     bipolar  . Hypertension   . Mental retardation   . Prostate hypertrophy   . Crohn's colitis   . Asthma   . Hypercholesteremia   . Diabetes mellitus  Type II  . Parkinsonism, secondary   . Schizophrenia, chronic condition   . Osteoporosis   . Incontinence of feces   . Bipolar affective disorder     chronic  . BPH (benign prostatic hyperplasia)   . Chronic bronchitis   . Insomnia   . PTSD (post-traumatic stress disorder)     Social History:  History   Social History  . Marital Status: Single    Spouse Name: N/A  . Number of Children: N/A  . Years of Education: N/A   Occupational History  . disabled     Social History Main Topics  . Smoking status: Never Smoker   . Smokeless tobacco: Not on file  . Alcohol Use: No  . Drug Use: No  . Sexual Activity: Not on file   Other Topics Concern  . Not on file   Social History Narrative   Single, disabled, lives in home with full time caregiver- [name]   Right handed   caffeine use - none    Medications:   Current Outpatient Prescriptions on File Prior to Visit  Medication Sig Dispense Refill  . alendronate (FOSAMAX) 70 MG tablet     . aspirin EC 81 MG tablet Take 81 mg by mouth daily.    . diphenhydrAMINE (BENADRYL) 12.5 MG/5ML elixir Take 12.5 mg by mouth 3 (three) times daily.    Marland Kitchen EPINEPHrine (EPI-PEN) 0.3 mg/0.3 mL DEVI Inject 0.3 mg into the muscle once. For allergic reaction to bees    . fluticasone (FLONASE) 50 MCG/ACT nasal spray Place 2 sprays into the nose daily.    Marland Kitchen lithium carbonate 300 MG capsule Take 300 mg by mouth 3 (three) times daily.     Marland Kitchen LORazepam (ATIVAN) 1 MG tablet Take 1 mg by mouth 3 (three) times daily as needed. For agitation or anxiety    . medroxyPROGESTERone (DEPO-PROVERA) 150 MG/ML injection Inject 150 mg into the muscle once a week. On fridays    . metoprolol succinate (TOPROL-XL) 50 MG 24 hr tablet Take 50 mg by mouth daily. Take with or immediately following a meal.    . omeprazole (PRILOSEC) 20 MG capsule Take 20 mg by mouth 2 (two) times daily.    . ranitidine (ZANTAC) 150 MG tablet     . risperiDONE (RISPERDAL) 1 MG/ML oral solution Take 1 mg by mouth at bedtime.     . Selenium Sulfide (SELSUN BLUE EX) Apply 1 application topically daily. Shampoo once a day    . simvastatin (ZOCOR) 10 MG tablet Take 10 mg by mouth at bedtime.    . Valproic Acid (DEPAKENE) 250 MG/5ML SYRP syrup Take 250 mg by mouth every morning.    . Valproic Acid (DEPAKENE) 250 MG/5ML SYRP syrup Take 1,750 mg by mouth at bedtime.     No current facility-administered medications on file prior to visit.    Allergies:   Allergies   Allergen Reactions  . Bee Venom Anaphylaxis  . Nutritional Supplements Anaphylaxis    Care giver unsure if this is correct  . Latex Hives  . Penicillins Cross Reactors Hives and Rash  . Sulfa Drugs Cross Reactors Hives and Rash    Physical Exam General: Frail cachectic looking middle-age Caucasian male, seated, in no evident distress Head: head normocephalic and atraumatic.   Neck: supple with no carotid or supraclavicular bruits Cardiovascular: regular rate and rhythm, no murmurs Musculoskeletal: no deformity Skin:  no rash/petichiae Vascular:  Normal pulses all extremities  Neurologic Exam Mental Status: Awake and  fully alert. Oriented to place and person only. Recent and remote memory poor. Attention span, concentration and fund of knowledge diminished Mood and affect appropriate. Mini-Mental status exam not done   following 3 step commands. Speech is pressured  can speak fluent sentences if he can slow down Cranial Nerves: Fundoscopic exam reveals sharp disc margins. Pupils equal, briskly reactive to light. Extraocular movements full without nystagmus. Visual fields full to confrontation. Hearing intact. Facial sensation intact. Face, tongue, palate moves normally and symmetrically.  Motor: Normal bulk and tone. Normal strength in all tested extremity muscles. No visible orofacial dyskinesias. No resting tremor but mild fine action tremor of outstretched upper extremities. Mild cogwheel rigidity at both wrists upon activation only. Able to get up from the chair with arms folded across his chest. Sensory.: intact to touch , pinprick , position and vibratory sensation.  Coordination: Rapid alternating movements normal in all extremities. Finger-to-nose and heel-to-shin performed accurately bilaterally. Gait and Station: Arises from chair without difficulty. Stance is normal. Gait short steps with initial shuffling and initiation apraxia and some festination but no stooped posture. Able to  heel, toe and tandem walk without difficulty.  Reflexes: 1+ and symmetric. Toes downgoing.      ASSESSMENT: 28 year Caucasian male with a lifelong history of mental retardation and behavioral psychosis with drug-induced Parkinsonism with recent worsening of gait with freezing episodes and gait apraxia improved on sinemet. Bilateral upper extremity tremor likely multifactorial from drug effect now improved    PLAN: I had a long discussion with the patient and caregiver [facility] regarding his tremors parkinsonian symptoms and gait abnormality and answered questions. Continue Sinemet in the current dose of 25/100 one tablet 3 times daily. I'm reluctant to increase it given his recent episode of psychosis and being on multiple psychoactive medications. I advised the patient to get up slowly and walk carefully and avoid making sudden jerky body movements. No further neurological testing is indicated at the present time. He was advised to return for follow-up in 6 months with Butch Penny, NP or call earlier if necessary    Delia Heady, MD Note: This document was prepared with digital dictation and possible smart phrase technology. Any transcriptional errors that result from this process are unintentional.

## 2015-04-12 NOTE — Patient Instructions (Signed)
I had a long discussion with the patient and caregiver [facility] regarding his tremors parkinsonian symptoms and gait abnormality and answered questions. Continue Sinemet in the current dose of 25/100 one tablet 3 times daily. I'm reluctant to increase it given his recent episode of psychosis and being on multiple psychoactive medications. I advised the patient to get up slowly and walk carefully and avoid making sudden jerky body movements. No further neurological testing is indicated at the present time. He was advised to return for follow-up in 6 months with Butch Penny, NP or call earlier if necessary

## 2015-04-13 ENCOUNTER — Ambulatory Visit: Payer: Medicare Other | Admitting: Neurology

## 2015-10-16 ENCOUNTER — Ambulatory Visit: Payer: Medicare Other | Admitting: Adult Health

## 2016-07-29 DIAGNOSIS — L603 Nail dystrophy: Secondary | ICD-10-CM | POA: Insufficient documentation

## 2017-01-27 DIAGNOSIS — S91119A Laceration without foreign body of unspecified toe without damage to nail, initial encounter: Secondary | ICD-10-CM | POA: Insufficient documentation

## 2017-03-05 ENCOUNTER — Emergency Department (HOSPITAL_COMMUNITY): Payer: Medicare Other

## 2017-03-05 ENCOUNTER — Inpatient Hospital Stay (HOSPITAL_COMMUNITY)
Admission: EM | Admit: 2017-03-05 | Discharge: 2017-03-10 | DRG: 641 | Disposition: A | Discharge status: Home Health | Payer: Medicare Other | Attending: Internal Medicine | Admitting: Internal Medicine

## 2017-03-05 ENCOUNTER — Encounter (HOSPITAL_COMMUNITY): Payer: Self-pay | Admitting: Internal Medicine

## 2017-03-05 ENCOUNTER — Observation Stay (HOSPITAL_COMMUNITY): Payer: Medicare Other

## 2017-03-05 DIAGNOSIS — R296 Repeated falls: Secondary | ICD-10-CM

## 2017-03-05 DIAGNOSIS — F319 Bipolar disorder, unspecified: Secondary | ICD-10-CM | POA: Diagnosis present

## 2017-03-05 DIAGNOSIS — Z882 Allergy status to sulfonamides status: Secondary | ICD-10-CM

## 2017-03-05 DIAGNOSIS — D649 Anemia, unspecified: Secondary | ICD-10-CM

## 2017-03-05 DIAGNOSIS — Z681 Body mass index (BMI) 19 or less, adult: Secondary | ICD-10-CM

## 2017-03-05 DIAGNOSIS — K219 Gastro-esophageal reflux disease without esophagitis: Secondary | ICD-10-CM | POA: Diagnosis present

## 2017-03-05 DIAGNOSIS — G4700 Insomnia, unspecified: Secondary | ICD-10-CM | POA: Diagnosis present

## 2017-03-05 DIAGNOSIS — W19XXXD Unspecified fall, subsequent encounter: Secondary | ICD-10-CM | POA: Diagnosis not present

## 2017-03-05 DIAGNOSIS — N179 Acute kidney failure, unspecified: Secondary | ICD-10-CM | POA: Insufficient documentation

## 2017-03-05 DIAGNOSIS — E870 Hyperosmolality and hypernatremia: Secondary | ICD-10-CM | POA: Diagnosis not present

## 2017-03-05 DIAGNOSIS — R451 Restlessness and agitation: Secondary | ICD-10-CM

## 2017-03-05 DIAGNOSIS — I10 Essential (primary) hypertension: Secondary | ICD-10-CM | POA: Diagnosis present

## 2017-03-05 DIAGNOSIS — Z880 Allergy status to penicillin: Secondary | ICD-10-CM

## 2017-03-05 DIAGNOSIS — F79 Unspecified intellectual disabilities: Secondary | ICD-10-CM | POA: Diagnosis present

## 2017-03-05 DIAGNOSIS — N400 Enlarged prostate without lower urinary tract symptoms: Secondary | ICD-10-CM

## 2017-03-05 DIAGNOSIS — E119 Type 2 diabetes mellitus without complications: Secondary | ICD-10-CM | POA: Diagnosis present

## 2017-03-05 DIAGNOSIS — Z7983 Long term (current) use of bisphosphonates: Secondary | ICD-10-CM

## 2017-03-05 DIAGNOSIS — M810 Age-related osteoporosis without current pathological fracture: Secondary | ICD-10-CM | POA: Diagnosis present

## 2017-03-05 DIAGNOSIS — R627 Adult failure to thrive: Secondary | ICD-10-CM | POA: Diagnosis present

## 2017-03-05 DIAGNOSIS — G219 Secondary parkinsonism, unspecified: Secondary | ICD-10-CM

## 2017-03-05 DIAGNOSIS — Z888 Allergy status to other drugs, medicaments and biological substances status: Secondary | ICD-10-CM

## 2017-03-05 DIAGNOSIS — Z8249 Family history of ischemic heart disease and other diseases of the circulatory system: Secondary | ICD-10-CM

## 2017-03-05 DIAGNOSIS — Z79899 Other long term (current) drug therapy: Secondary | ICD-10-CM

## 2017-03-05 DIAGNOSIS — J42 Unspecified chronic bronchitis: Secondary | ICD-10-CM | POA: Diagnosis present

## 2017-03-05 DIAGNOSIS — N251 Nephrogenic diabetes insipidus: Secondary | ICD-10-CM | POA: Diagnosis present

## 2017-03-05 DIAGNOSIS — R4189 Other symptoms and signs involving cognitive functions and awareness: Secondary | ICD-10-CM | POA: Diagnosis present

## 2017-03-05 DIAGNOSIS — W19XXXA Unspecified fall, initial encounter: Secondary | ICD-10-CM

## 2017-03-05 DIAGNOSIS — R001 Bradycardia, unspecified: Secondary | ICD-10-CM | POA: Diagnosis present

## 2017-03-05 DIAGNOSIS — F209 Schizophrenia, unspecified: Secondary | ICD-10-CM

## 2017-03-05 DIAGNOSIS — I480 Paroxysmal atrial fibrillation: Secondary | ICD-10-CM | POA: Diagnosis present

## 2017-03-05 DIAGNOSIS — Z7982 Long term (current) use of aspirin: Secondary | ICD-10-CM

## 2017-03-05 DIAGNOSIS — F4310 Post-traumatic stress disorder, unspecified: Secondary | ICD-10-CM | POA: Diagnosis present

## 2017-03-05 DIAGNOSIS — Z91030 Bee allergy status: Secondary | ICD-10-CM

## 2017-03-05 DIAGNOSIS — D539 Nutritional anemia, unspecified: Secondary | ICD-10-CM | POA: Diagnosis present

## 2017-03-05 DIAGNOSIS — Z91040 Latex allergy status: Secondary | ICD-10-CM

## 2017-03-05 LAB — HEMOGLOBIN AND HEMATOCRIT, BLOOD
HCT: 26.8 % — ABNORMAL LOW (ref 39.0–52.0)
Hemoglobin: 8.2 g/dL — ABNORMAL LOW (ref 13.0–17.0)

## 2017-03-05 LAB — CBC WITH DIFFERENTIAL/PLATELET
Basophils Absolute: 0 10*3/uL (ref 0.0–0.1)
Basophils Relative: 0 %
EOS PCT: 2 %
Eosinophils Absolute: 0.2 10*3/uL (ref 0.0–0.7)
HEMATOCRIT: 25.8 % — AB (ref 39.0–52.0)
Hemoglobin: 8 g/dL — ABNORMAL LOW (ref 13.0–17.0)
LYMPHS ABS: 1.5 10*3/uL (ref 0.7–4.0)
Lymphocytes Relative: 19 %
MCH: 34 pg (ref 26.0–34.0)
MCHC: 31 g/dL (ref 30.0–36.0)
MCV: 109.8 fL — AB (ref 78.0–100.0)
Monocytes Absolute: 1.1 10*3/uL — ABNORMAL HIGH (ref 0.1–1.0)
Monocytes Relative: 13 %
NEUTROS ABS: 5.2 10*3/uL (ref 1.7–7.7)
Neutrophils Relative %: 66 %
PLATELETS: 141 10*3/uL — AB (ref 150–400)
RBC: 2.35 MIL/uL — AB (ref 4.22–5.81)
RDW: 17.2 % — ABNORMAL HIGH (ref 11.5–15.5)
WBC: 7.9 10*3/uL (ref 4.0–10.5)

## 2017-03-05 LAB — COMPREHENSIVE METABOLIC PANEL
ALBUMIN: 2.9 g/dL — AB (ref 3.5–5.0)
ALT: 39 U/L (ref 17–63)
ANION GAP: 5 (ref 5–15)
AST: 35 U/L (ref 15–41)
Alkaline Phosphatase: 79 U/L (ref 38–126)
BUN: 30 mg/dL — AB (ref 6–20)
CHLORIDE: 127 mmol/L — AB (ref 101–111)
CO2: 26 mmol/L (ref 22–32)
Calcium: 8.1 mg/dL — ABNORMAL LOW (ref 8.9–10.3)
Creatinine, Ser: 1.39 mg/dL — ABNORMAL HIGH (ref 0.61–1.24)
GFR calc Af Amer: >60 mL/min (ref >60)
GFR, EST NON AFRICAN AMERICAN: 56 mL/min — AB (ref >60)
GLUCOSE: 88 mg/dL (ref 65–99)
POTASSIUM: 4 mmol/L (ref 3.5–5.1)
Sodium: 158 mmol/L — ABNORMAL HIGH (ref 135–145)
Total Bilirubin: 0.6 mg/dL (ref 0.3–1.2)
Total Protein: 5.8 g/dL — ABNORMAL LOW (ref 6.5–8.1)

## 2017-03-05 LAB — URINALYSIS, ROUTINE W REFLEX MICROSCOPIC
Bilirubin Urine: NEGATIVE
GLUCOSE, UA: NEGATIVE mg/dL
HGB URINE DIPSTICK: NEGATIVE
Ketones, ur: NEGATIVE mg/dL
LEUKOCYTES UA: NEGATIVE
Nitrite: NEGATIVE
PH: 8 (ref 5.0–8.0)
PROTEIN: NEGATIVE mg/dL
SPECIFIC GRAVITY, URINE: 1.01 (ref 1.005–1.030)

## 2017-03-05 LAB — BASIC METABOLIC PANEL
ANION GAP: 4 — AB (ref 5–15)
BUN: 25 mg/dL — ABNORMAL HIGH (ref 6–20)
CHLORIDE: 126 mmol/L — AB (ref 101–111)
CO2: 27 mmol/L (ref 22–32)
CREATININE: 1.28 mg/dL — AB (ref 0.61–1.24)
Calcium: 8.1 mg/dL — ABNORMAL LOW (ref 8.9–10.3)
GFR calc non Af Amer: >60 mL/min (ref >60)
Glucose, Bld: 111 mg/dL — ABNORMAL HIGH (ref 65–99)
Potassium: 4 mmol/L (ref 3.5–5.1)
SODIUM: 157 mmol/L — AB (ref 135–145)

## 2017-03-05 LAB — RETICULOCYTES
RBC.: 2.45 MIL/uL — ABNORMAL LOW (ref 4.22–5.81)
Retic Count, Absolute: 73.5 10*3/uL (ref 19.0–186.0)
Retic Ct Pct: 3 % (ref 0.4–3.1)

## 2017-03-05 LAB — AMMONIA: AMMONIA: 20 umol/L (ref 9–35)

## 2017-03-05 LAB — VALPROIC ACID LEVEL: Valproic Acid Lvl: 67 ug/mL (ref 50.0–100.0)

## 2017-03-05 LAB — LITHIUM LEVEL: Lithium Lvl: 0.85 mmol/L (ref 0.60–1.20)

## 2017-03-05 MED ORDER — CLONAZEPAM 0.125 MG PO TBDP
0.2500 mg | ORAL_TABLET | Freq: Two times a day (BID) | ORAL | Status: DC
  Administered 2017-03-06 – 2017-03-10 (×10): 0.25 mg via ORAL
  Filled 2017-03-05 (×10): qty 2

## 2017-03-05 MED ORDER — TAMSULOSIN HCL 0.4 MG PO CAPS
0.4000 mg | ORAL_CAPSULE | Freq: Every day | ORAL | Status: DC
  Administered 2017-03-06 – 2017-03-10 (×5): 0.4 mg via ORAL
  Filled 2017-03-05 (×5): qty 1

## 2017-03-05 MED ORDER — DEXTROSE-NACL 5-0.45 % IV SOLN
INTRAVENOUS | Status: DC
  Administered 2017-03-05 – 2017-03-06 (×2): via INTRAVENOUS

## 2017-03-05 MED ORDER — PANTOPRAZOLE SODIUM 40 MG PO TBEC
40.0000 mg | DELAYED_RELEASE_TABLET | Freq: Every day | ORAL | Status: DC
  Administered 2017-03-06 – 2017-03-10 (×5): 40 mg via ORAL
  Filled 2017-03-05 (×5): qty 1

## 2017-03-05 MED ORDER — ORAL CARE MOUTH RINSE
15.0000 mL | Freq: Two times a day (BID) | OROMUCOSAL | Status: DC
  Administered 2017-03-06 – 2017-03-09 (×8): 15 mL via OROMUCOSAL

## 2017-03-05 MED ORDER — ACETAMINOPHEN 650 MG RE SUPP: 650.0000 mg | Freq: Four times a day (QID) | RECTAL | Status: DC | PRN

## 2017-03-05 MED ORDER — CLONAZEPAM 0.5 MG PO TABS: 0.5000 mg | ORAL_TABLET | Freq: Two times a day (BID) | ORAL | Status: DC | PRN

## 2017-03-05 MED ORDER — SODIUM CHLORIDE 0.9 % IV BOLUS (SEPSIS)
500.0000 mL | Freq: Once | INTRAVENOUS | Status: AC
  Administered 2017-03-05: 500 mL via INTRAVENOUS

## 2017-03-05 MED ORDER — ASPIRIN EC 81 MG PO TBEC
81.0000 mg | DELAYED_RELEASE_TABLET | Freq: Every day | ORAL | Status: DC
Start: 2017-03-06 — End: 2017-03-10
  Administered 2017-03-06 – 2017-03-10 (×5): 81 mg via ORAL
  Filled 2017-03-05 (×5): qty 1

## 2017-03-05 MED ORDER — LITHIUM CARBONATE 300 MG PO CAPS
300.0000 mg | ORAL_CAPSULE | Freq: Every day | ORAL | Status: DC
  Administered 2017-03-05: 300 mg via ORAL
  Filled 2017-03-05: qty 1

## 2017-03-05 MED ORDER — VALPROATE SODIUM 250 MG/5ML PO SOLN
750.0000 mg | Freq: Every day | ORAL | Status: DC
  Administered 2017-03-06 – 2017-03-10 (×5): 750 mg via ORAL
  Filled 2017-03-05 (×5): qty 15

## 2017-03-05 MED ORDER — OLANZAPINE 5 MG PO TBDP
5.0000 mg | ORAL_TABLET | Freq: Every day | ORAL | Status: DC
  Administered 2017-03-05 – 2017-03-09 (×5): 5 mg via ORAL
  Filled 2017-03-05 (×4): qty 1

## 2017-03-05 MED ORDER — ONDANSETRON HCL 4 MG/2ML IJ SOLN: 4.0000 mg | Freq: Four times a day (QID) | INTRAMUSCULAR | Status: DC | PRN

## 2017-03-05 MED ORDER — ONDANSETRON HCL 4 MG PO TABS: 4.0000 mg | ORAL_TABLET | Freq: Four times a day (QID) | ORAL | Status: DC | PRN

## 2017-03-05 MED ORDER — MEDROXYPROGESTERONE ACETATE 150 MG/ML IM SUSP
150.0000 mg | INTRAMUSCULAR | Status: DC
  Administered 2017-03-07: 150 mg via INTRAMUSCULAR
  Filled 2017-03-05: qty 1

## 2017-03-05 MED ORDER — OLANZAPINE 5 MG PO TBDP
5.0000 mg | ORAL_TABLET | Freq: Two times a day (BID) | ORAL | Status: DC | PRN
  Filled 2017-03-05: qty 1

## 2017-03-05 MED ORDER — TRAMADOL HCL 50 MG PO TABS: 50.0000 mg | ORAL_TABLET | Freq: Three times a day (TID) | ORAL | Status: DC | PRN

## 2017-03-05 MED ORDER — ACETAMINOPHEN 325 MG PO TABS: 650.0000 mg | ORAL_TABLET | Freq: Four times a day (QID) | ORAL | Status: DC | PRN

## 2017-03-05 MED ORDER — FAMOTIDINE 20 MG PO TABS
20.0000 mg | ORAL_TABLET | Freq: Two times a day (BID) | ORAL | Status: DC
  Administered 2017-03-05 – 2017-03-10 (×10): 20 mg via ORAL
  Filled 2017-03-05 (×10): qty 1

## 2017-03-05 MED ORDER — MAGNESIUM HYDROXIDE 400 MG/5ML PO SUSP: 30.0000 mL | Freq: Every day | ORAL | Status: DC | PRN

## 2017-03-05 MED ORDER — CARBIDOPA-LEVODOPA 25-100 MG PO TABS
1.0000 | ORAL_TABLET | Freq: Three times a day (TID) | ORAL | Status: DC
  Administered 2017-03-05 – 2017-03-10 (×14): 1 via ORAL
  Filled 2017-03-05 (×15): qty 1

## 2017-03-05 MED ORDER — VALPROATE SODIUM 250 MG/5ML PO SOLN
1250.0000 mg | Freq: Every day | ORAL | Status: DC
  Administered 2017-03-05 – 2017-03-09 (×5): 1250 mg via ORAL
  Filled 2017-03-05 (×5): qty 25

## 2017-03-05 MED ORDER — SIMVASTATIN 10 MG PO TABS
10.0000 mg | ORAL_TABLET | Freq: Every day | ORAL | Status: DC
  Administered 2017-03-05 – 2017-03-09 (×5): 10 mg via ORAL
  Filled 2017-03-05 (×5): qty 1
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
## 2017-03-05 NOTE — ED Notes (Signed)
Pt. Documented in error DG Chest 2 view. 

## 2017-03-05 NOTE — ED Provider Notes (Signed)
[facility]-EMERGENCY DEPT Provider Note   CSN: [id number] Arrival date & time: 03/05/17  1539     History   Chief Complaint No chief complaint on file.   HPI William [name] is a 56 y.o. male.  The history is provided by a caregiver. No language interpreter was used.    William [name] is a 56 y.o. male who presents to the Emergency Department complaining of AMS.  Level V caveat due to AMS.  History is provided by the patient's caregivers. They report that he has a history of MR and schizophrenia and has episodes of combative behavior. Over the last several weeks he had worsening episodes of combative behavior with multiple falls and striking his head on the ground. He has been seen by his psychiatrist and had labs drawn a week ago but they were resulted out yesterday and noted an elevation in his creatinine. He was told to hold his lithium due to elevation in his creatinine and his psychiatrist wanted him evaluated for possible medical causes of his symptoms. No fevers, vomiting, diarrhea. Caregiver states that his speech is more difficult to understand over the last several weeks compared to previously.  Past Medical History:  Diagnosis Date  . Asthma   . Bipolar affective disorder    chronic  . BPH (benign prostatic hyperplasia)   . Chronic bronchitis   . Crohn's colitis   . Diabetes mellitus    Type II  . GERD (gastroesophageal reflux disease)   . Hypercholesteremia   . Hypertension   . Incontinence of feces   . Insomnia   . Mental disorder    bipolar  . Mental retardation   . Osteoporosis   . Parkinsonism, secondary   . Prostate hypertrophy   . PTSD (post-traumatic stress disorder)   . Schizophrenia, chronic condition     There are no active problems to display for this patient.   Past Surgical History:  Procedure Laterality Date  . TONSILLECTOMY AND ADENOIDECTOMY     as child  . TOOTH EXTRACTION  10/22/2011   Procedure: DENTAL RESTORATION/EXTRACTIONS;  Surgeon:  Georgia Lopes, DDS;  Location: [facility] OR;  Service: Oral Surgery;  Laterality: Bilateral;  . TRANSURETHRAL RESECTION OF PROSTATE     x 2       Home Medications    Prior to Admission medications   Medication Sig Start Date End Date Taking? Authorizing Provider  alendronate (FOSAMAX) 70 MG tablet  01/12/15   [provider]  alfuzosin (UROXATRAL) 10 MG 24 hr tablet  04/10/15   [provider]  aspirin EC 81 MG tablet Take 81 mg by mouth daily.    [provider]  carbidopa-levodopa (SINEMET IR) 25-100 MG per tablet  04/10/15   [provider]  diphenhydrAMINE (BENADRYL) 12.5 MG/5ML elixir Take 12.5 mg by mouth 3 (three) times daily.    [provider]  EPINEPHrine (EPI-PEN) 0.3 mg/0.3 mL DEVI Inject 0.3 mg into the muscle once. For allergic reaction to bees    [provider]  fluticasone (FLONASE) 50 MCG/ACT nasal spray Place 2 sprays into the nose daily.    [provider]  lithium carbonate 300 MG capsule Take 300 mg by mouth 3 (three) times daily.     [provider]  LORazepam (ATIVAN) 1 MG tablet Take 1 mg by mouth 3 (three) times daily as needed. For agitation or anxiety    [provider]  medroxyPROGESTERone (DEPO-PROVERA) 150 MG/ML injection Inject 150 mg into the  muscle once a week. On fridays    [provider]  metoprolol succinate (TOPROL-XL) 50 MG 24 hr tablet Take 50 mg by mouth daily. Take with or immediately following a meal.    [provider]  mirtazapine (REMERON) 7.5 MG tablet  04/05/15   [provider]  omeprazole (PRILOSEC) 20 MG capsule Take 20 mg by mouth 2 (two) times daily.    [provider]  ranitidine (ZANTAC) 150 MG tablet  01/08/15   [provider]  risperiDONE (RISPERDAL) 1 MG/ML oral solution Take 1 mg by mouth at bedtime.     [provider]  Selenium Sulfide (SELSUN BLUE EX) Apply 1 application topically daily. Shampoo once a day     [provider]  simvastatin (ZOCOR) 10 MG tablet Take 10 mg by mouth at bedtime.    [provider]  Valproic Acid (DEPAKENE) 250 MG/5ML SYRP syrup Take 250 mg by mouth every morning.    [provider]  Valproic Acid (DEPAKENE) 250 MG/5ML SYRP syrup Take 1,750 mg by mouth at bedtime.    [provider]    Family History Family History  Problem Relation Age of Onset  . Diabetes Father   . Heart disease Father   . Diabetes Mother   . Heart disease Mother   . Dementia Mother     Social History Social History  Substance Use Topics  . Smoking status: Never Smoker  . Smokeless tobacco: Not on file  . Alcohol use No     Allergies   Bee venom; Nutritional supplements; Latex; Penicillins cross reactors; and Sulfa drugs cross reactors   Review of Systems Review of Systems  All other systems reviewed and are negative.    Physical Exam Updated Vital Signs BP 125/77 (BP Location: Left Arm)   Pulse 87   Temp 98.7 F (37.1 C) (Oral)   Resp 18   SpO2 100%   Physical Exam  Constitutional: He appears well-developed and well-nourished.  Sitting on side of bed with hunched posture.  HENT:  Head: Normocephalic.  Swelling and tenderness to the left forehead. Mild right periorbital ecchymosis.  Cardiovascular: Normal rate and regular rhythm.   No murmur heard. Pulmonary/Chest: Effort normal and breath sounds normal. No respiratory distress.  Abdominal: Soft. There is no tenderness. There is no rebound and no guarding.  Musculoskeletal: He exhibits no edema or tenderness.  Mild tenderness to palpation over the left hip.  Neurological:  Appears sleepy but conversant. Dysarthric speech. Difficulty in understanding and following simple commands. 4 out of 5 strength in all 4 extremities.  Skin: Skin is warm and dry.  Nursing note and vitals reviewed.    ED Treatments / Results  Labs (all labs ordered are listed, but only abnormal results are  displayed) Labs Reviewed  COMPREHENSIVE METABOLIC PANEL  CBC WITH DIFFERENTIAL/PLATELET  URINALYSIS, ROUTINE W REFLEX MICROSCOPIC  LITHIUM LEVEL  VALPROIC ACID LEVEL  AMMONIA    EKG  EKG Interpretation None       Radiology No results found.  Procedures Procedures (including critical care time)  Medications Ordered in ED Medications - No data to display   Initial Impression / Assessment and Plan / ED Course  I have reviewed the triage vital signs and the nursing notes.  Pertinent labs & imaging results that were available during my care of the patient were reviewed by me and considered in my medical decision making (see chart for details).    Patient here for evaluation  of increased agitation and combative behavior. Outpatient labs for a week ago demonstrated mild elevation in serum creatinine 1.28. Today he has worsening in his creatinine with hypernatremia. Given rapid change in his sodium plan to admit for further treatment. Hospitalist consulted for admission.  Per group home - do not use ativan due to severe agitation.    Group home also reports 2LNC oxygen at night.     Final Clinical Impressions(s) / ED Diagnoses   Final diagnoses:  Acute hypernatremia    New Prescriptions New Prescriptions   No medications on file     Tilden Fossa, MD 03/05/17 2330

## 2017-03-05 NOTE — ED Triage Notes (Addendum)
Pt caregiver pt is experiencing a schizophrenic relapse. Pt has been having odd behavior for approx 3-4 weeks. Pt strips his clothes off in public, lays in the woods with no clothes on, hits himself, very combative, having hallucinations, and not taking care of himself. Pt hasn't been sleeping.

## 2017-03-05 NOTE — Progress Notes (Signed)
[name] from the pt's foster care home reported the following recent medication changes to the CSW.  [name] reported recent medication changes: 5/31 Stopped ambient and toprol XL 6/11: Increased zyprexa PRN 6/15:Lithium and Klonopin were to start today. Pt also now has Klonopin PRN, per [name] at  [employer] [employer] [employer]  [place]olly reports she has the medical records for the pt if they are needed.    [name] also reported, "Do not give him Ativan it has an adverse reaction and becomes violent and belligerent".  [name] also reported pt began 2 liters of oxygen last month due to an overnight study saying his SAT was at 40 at night.   CSW will [name] on the night of 6/27.  Dorothe PeaJonathan F. Nester Bachus, Francesco SorLCSWA, LCAS, CSI Clinical Social Worker Ph: [phone]

## 2017-03-05 NOTE — Progress Notes (Signed)
Consult request has been received. CSW attempting to follow up at present time.  Rook Maue F. Elie Gragert, LCSWA, LCAS Clinical Social Worker Ph: [phone]  

## 2017-03-05 NOTE — H&P (Signed)
History and Physical    William [name]:[id number] DOB: Nov 24, 1960 DOA: 03/05/2017  PCP: Abigail Miyamoto, MD   Patient coming from: Home  Chief Complaint: Told to report to the ED for evaluation of AKI  HPI: William [name] is a 56 y.o. gentleman with a history of cognitive impairment, schizophrenia, Bipolar disorder, HTN, BPH, secondary Parkinsonism, and self-destructive behaviors who was brought to the ED tonight by caregivers for follow-up of abnormal labs.  Caregivers have documented several weeks to months of combative, aggressive, and self-destructive behaviors including hitting, kicking, and banging his head against the wall.  He also intermittently refuses to eat and has been caught "pocketing" his medications in his mouth.  He has also had recurrent falls.  He is under the care of a psychiatrist as an outpatient.  He was sent for labs at the beginning of June which showed a sodium level of 148, creatinine 1.28, Hgb 10.  Labs were repeated on Friday and reportedly showed creatinine of 1.5 and sodium greater than 150.  Lithium taper was recommended and the patient was also advised to reported to the ED for evaluation of hypernatremia and AKI (baseline creatinine normal in this system two years ago).  Per his caregiver, he has been eating and drinking well when he is compliant.  No known LOC or seizure activity.  He has had recent falls that have led to extensive bruising to the lateral aspect of his left leg.  He has a laceration to the right side of his posterior scalp (has stitches that are due to come out on Friday).  No gross bleeding.  No chest pain or shortness of breath.  Of note, he was diagnosed with relative hypotension on May 31st, and his Toprol XL (and Ambien) was discontinued on this day.  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  ED Course: Hgb 8 (down from 10 earlier this month).  Platelet count 141.  Sodium 158.  BUN 30.   Creatinine 1.39.  Lithium level WNL.  Valproic acid level WNL.  Head CT negative for acute process.  Chest xray negative for acute process.  Hip/pelvis xray negative for acute process.  Hospitalist asked to admit.  Review of Systems: 10 systems reviewed per caregiver.  Patient unable to give his own history due to baseline cognitive impairment.   Past Medical History:  Diagnosis Date  . Asthma   . Bipolar affective disorder (HCC)    chronic  . BPH (benign prostatic hyperplasia)   . Chronic bronchitis (HCC)   . GERD (gastroesophageal reflux disease)   . Hypercholesteremia   . Hypertension   . Incontinence of feces   . Insomnia   . Mental disorder    bipolar  . Mental retardation   . Osteoporosis   . Parkinsonism, secondary (HCC)   . Prostate hypertrophy   . PTSD (post-traumatic stress disorder)   . Schizophrenia, chronic condition (HCC)   Caregiver reports that the patient does not have diabetes or Crohn's disease.  Past Surgical History:  Procedure Laterality Date  . TONSILLECTOMY AND ADENOIDECTOMY     as child  . TOOTH EXTRACTION  10/22/2011   Procedure: DENTAL RESTORATION/EXTRACTIONS;  Surgeon: Georgia Lopes, DDS;  Location: [facility] OR;  Service: Oral Surgery;  Laterality: Bilateral;  . TRANSURETHRAL RESECTION OF PROSTATE     x 2     reports that he has never smoked. He does not have any smokeless tobacco history on file. He  reports that he does not drink alcohol or use drugs.  Allergies  Allergen Reactions  . Bee Venom Anaphylaxis  . Nutritional Supplements Anaphylaxis    Care giver unsure if this is correct  . Latex Hives  . Penicillins Cross Reactors Hives and Rash  . Sulfa Drugs Cross Reactors Hives and Rash    Family History  Problem Relation Age of Onset  . Diabetes Father   . Heart disease Father   . Diabetes Mother   . Heart disease Mother   . Dementia Mother      Prior to Admission medications   Medication Sig Start Date End Date Taking? Authorizing  Provider  acetaminophen (TYLENOL) 325 MG tablet Take 650 mg by mouth every 6 (six) hours as needed for moderate pain.   Yes [provider]  acetaminophen (TYLENOL) 500 MG tablet Take 1,000 mg by mouth every 6 (six) hours as needed for moderate pain.   Yes [provider]  aspirin EC 81 MG tablet Take 81 mg by mouth daily.   Yes [provider]  azelastine (ASTELIN) 0.1 % nasal spray Place 1 spray into both nostrils 2 (two) times daily as needed for rhinitis or allergies. Use in each nostril as directed   Yes [provider]  Benzocaine-Menthol (CEPACOL SORE THROAT) 15-3.6 MG LOZG Use as directed 1 lozenge in the mouth or throat every 2 (two) hours as needed (sore throat).   Yes [provider]  bismuth subsalicylate (BISMATROL) 262 MG/15ML suspension Take 30 mLs by mouth every 6 (six) hours as needed.   Yes [provider]  Calamine 8-8 % LOTN Apply 1 application topically as needed (rash).   Yes [provider]  Calcium Carb-Cholecalciferol (CALCIUM-VITAMIN D) 600-400 MG-UNIT TABS Take 2 tablets by mouth daily.   Yes [provider]  carbidopa-levodopa (SINEMET IR) 25-100 MG per tablet Take 1 tablet by mouth 3 (three) times daily.  04/10/15  Yes [provider]  clonazePAM (KLONOPIN) 0.25 MG disintegrating tablet Take 0.25 mg by mouth 2 (two) times daily.   Yes [provider]  clonazePAM (KLONOPIN) 0.5 MG tablet Take 0.5 mg by mouth 2 (two) times daily as needed for anxiety.   Yes [provider]  Dentifrices (SENSODYNE DT) Place onto teeth. Brush teeth twice a day.   Yes [provider]  diphenhydrAMINE (BENADRYL) spray Apply 1 application topically every 6 (six) hours as needed for itching.   Yes [provider]  ENSURE PLUS (ENSURE PLUS) LIQD Take 237 mLs by mouth 2 (two) times daily between meals.   Yes [provider]  fexofenadine (ALLEGRA) 180 MG tablet Take 180 mg by  mouth daily as needed for allergies or rhinitis.   Yes [provider]  Glycerin (OASIS MOISTURIZING MOUTH SPRAY) 35 % LIQD Use as directed 2 sprays in the mouth or throat daily as needed (dry mouth).   Yes [provider]  guaifenesin (ROBAFEN) 100 MG/5ML syrup Take 200 mg by mouth 3 (three) times daily as needed for cough.   Yes [provider]  hydrocortisone 1 % lotion Apply 1 application topically 4 (four) times daily as needed for itching.   Yes [provider]  hydrocortisone 1 % ointment Apply 1 application topically 4 (four) times daily as needed for itching.   Yes [provider]  hydrocortisone cream 1 % Apply 1 application topically 4 (four) times daily as needed for itching.   Yes [provider]  ibuprofen (ADVIL,MOTRIN) 200  MG tablet Take 400 mg by mouth every 8 (eight) hours as needed for moderate pain.   Yes [provider]  magnesium hydroxide (MILK OF MAGNESIA) 400 MG/5ML suspension Take 30 mLs by mouth daily as needed for mild constipation.   Yes [provider]  Miconazole Nitrate (NEOSPORIN AF EX) Apply 1 application topically 3 (three) times daily as needed (minor cuts).   Yes [provider]  neomycin-bacitracin-polymyxin (NEOSPORIN) [phone] ointment Apply 1 application topically 3 (three) times daily as needed (minor cuts).   Yes [provider]  OLANZapine zydis (ZYPREXA) 5 MG disintegrating tablet Take 5 mg by mouth at bedtime.   Yes [provider]  OLANZapine zydis (ZYPREXA) 5 MG disintegrating tablet Take 5 mg by mouth 2 (two) times daily as needed (agitation).   Yes [provider]  omeprazole (PRILOSEC) 20 MG capsule Take 20 mg by mouth 2 (two) times daily.   Yes [provider]  phenazopyridine (URISTAT) 95 MG tablet Take 95 mg by mouth 3 (three) times daily as needed for pain.   Yes [provider]  phenol (CHLORASEPTIC) 1.4 % LIQD Use as  directed 1 spray in the mouth or throat every 2 (two) hours as needed for throat irritation / pain.   Yes [provider]  povidone-iodine (BETADINE) 10 % external solution Apply 1 application topically as needed for wound care.   Yes [provider]  pseudoephedrine (SUDAFED) 30 MG tablet Take 30-60 mg by mouth every 4 (four) hours as needed for congestion.   Yes [provider]  ranitidine (ZANTAC) 150 MG tablet Take 150 mg by mouth daily.  01/08/15  Yes [provider]  Selenium Sulfide (SELSUN BLUE EX) Apply 1 application topically daily. Shampoo once a day   Yes [provider]  simvastatin (ZOCOR) 10 MG tablet Take 10 mg by mouth at bedtime.   Yes [provider]  Sunscreens (COPPERTONE KIDS SPF15 EX) Apply 1 application topically daily as needed (prevent sunburn).   Yes [provider]  tamsulosin (FLOMAX) 0.4 MG CAPS capsule Take 0.4 mg by mouth daily.   Yes [provider]  tolnaftate (TINACTIN) 1 % powder Apply 1 application topically daily as needed for itching.   Yes [provider]  tolnaftate (TINACTIN) 1 % spray Apply 1 spray topically daily as needed (itching).   Yes [provider]  traMADol (ULTRAM) 50 MG tablet Take 50 mg by mouth 3 (three) times daily as needed for moderate pain or severe pain.   Yes [provider]  Valproic Acid (DEPAKENE) 250 MG/5ML SYRP syrup Take 1,250 mg by mouth at bedtime.    Yes [provider]  Valproic Acid (DEPAKENE) 250 MG/5ML SYRP syrup Take 750 mg by mouth every morning.    Yes [provider]  Wheat Dextrin (BENEFIBER PO) Take 15 mLs by mouth daily as needed (constipation).   Yes [provider]  alendronate (FOSAMAX) 70 MG tablet Take 70 mg by mouth once a week.  01/12/15   [provider]  EPINEPHrine (EPI-PEN) 0.3 mg/0.3 mL DEVI Inject 0.3 mg into the muscle once. For allergic reaction to bees    [provider]  medroxyPROGESTERone (DEPO-PROVERA) 150 MG/ML injection Inject 150 mg into the muscle once a week. On fridays    [provider]    Physical Exam: Vitals:   03/05/17 1645 03/05/17 1647 03/05/17 2022  BP: 125/77  119/83  Pulse:  87 89  Resp:  18  14  Temp: 98.7 F (37.1 C)    TempSrc: Oral    SpO2:  100% 100%      Constitutional: NAD, calm, comfortable, chronically ill appearing Vitals:   03/05/17 1645 03/05/17 1647 03/05/17 2022  BP: 125/77  119/83  Pulse:  87 89  Resp:  18 14  Temp: 98.7 F (37.1 C)    TempSrc: Oral    SpO2:  100% 100%   Eyes: PERRL, lids and conjunctivae normal ENMT: Mucous membranes are moist. Posterior pharynx not completely visualized. Neck: normal appearance, supple, no masses Respiratory: clear to auscultation bilaterally, no wheezing, no crackles. Normal respiratory effort. No accessory muscle use.  Cardiovascular: Normal rate, regular rhythm, no murmurs / rubs / gallops. No extremity edema. 2+ pedal pulses. GI: abdomen is soft and compressible.  No distention.  No tenderness.  Bowel sounds are present. Musculoskeletal:  No joint deformity in upper and lower extremities. Moves all four extremities spontaneously.  Normal muscle tone.  Skin: no rashes, warm and dry.  Extensive bruising down the lateral aspect of his left leg.  Laceration to right posterior scalp.  Stitches present. Neurologic: Limited by cognitive impairment but no apparent focal deficits. Psychiatric: Oriented to person only.  Flat affect.  Judgment and insight impaired.    Labs on Admission: I have personally reviewed following labs and imaging studies  CBC:  Recent Labs Lab 03/05/17 1814  WBC 7.9  NEUTROABS 5.2  HGB 8.0*  HCT 25.8*  MCV 109.8*  PLT 141*   Basic Metabolic Panel:  Recent Labs Lab 03/05/17 1814  NA 158*  K 4.0  CL 127*  CO2 26  GLUCOSE 88  BUN 30*  CREATININE 1.39*  CALCIUM 8.1*   GFR: CrCl cannot be calculated (Unknown ideal  weight.). Liver Function Tests:  Recent Labs Lab 03/05/17 1814  AST 35  ALT 39  ALKPHOS 79  BILITOT 0.6  PROT 5.8*  ALBUMIN 2.9*    Recent Labs Lab 03/05/17 1815  AMMONIA 20   Urine analysis:    Component Value Date/Time   COLORURINE YELLOW 03/05/2017 2130   APPEARANCEUR CLEAR 03/05/2017 2130   LABSPEC 1.010 03/05/2017 2130   PHURINE 8.0 03/05/2017 2130   GLUCOSEU NEGATIVE 03/05/2017 2130   HGBUR NEGATIVE 03/05/2017 2130   BILIRUBINUR NEGATIVE 03/05/2017 2130   KETONESUR NEGATIVE 03/05/2017 2130   PROTEINUR NEGATIVE 03/05/2017 2130   NITRITE NEGATIVE 03/05/2017 2130   LEUKOCYTESUR NEGATIVE 03/05/2017 2130    Radiological Exams on Admission: Dg Chest 2 View  Result Date: 03/05/2017 CLINICAL DATA:  Altered mental status. Multiple falls. Aggressive behavior. EXAM: CHEST  2 VIEW COMPARISON:  Chest radiograph 08/08/2015. FINDINGS: The heart size and mediastinal contours are within normal limits. Both lungs are clear. Scattered granulomata. The visualized skeletal structures are unremarkable. IMPRESSION: No active cardiopulmonary disease.  Stable exam. Electronically Signed   By: Elsie StainJohn T Curnes M.D.   On: 03/05/2017 18:20   Ct Head Wo Contrast  Result Date: 03/05/2017 CLINICAL DATA:  Multiple falls. EXAM: CT HEAD WITHOUT CONTRAST TECHNIQUE: Contiguous axial images were obtained from the base of the skull through the vertex without intravenous contrast. COMPARISON:  08/08/2015 FINDINGS: Brain: Similar findings of atrophy with sulcal prominence centralized volume loss. Scattered periventricular hypodensities compatible with microvascular ischemic disease. Given background parenchymal abnormalities, there is no CT evidence superimposed acute large territory infarct. No intraparenchymal or extra-axial mass or hemorrhage. Normal size and configuration of the ventricles and the basilar cisterns. No midline shift. Vascular: Intracranial atherosclerosis. Skull: No displaced  calvarial  fracture. Sinuses/Orbits: Limited visualization the paranasal sinuses and mastoid air cells is normal. No air-fluid levels. Other: Regional soft tissues appear normal. IMPRESSION: Similar findings of atrophy and microvascular ischemic disease without acute intracranial process. Electronically Signed   By: Simonne Come M.D.   On: 03/05/2017 17:43   Dg Hip Unilat W Or Wo Pelvis 2-3 Views Left  Result Date: 03/05/2017 CLINICAL DATA:  Multiple falls recently. EXAM: DG HIP (WITH OR WITHOUT PELVIS) 2-3V LEFT COMPARISON:  None. FINDINGS: Examination is minimally degraded due to obliquity. No definite displaced hip or pelvic fracture. Left hip joint spaces appear preserved. No evidence avascular necrosis. Limited visualization the contralateral right hip is normal. Punctate phleboliths overlie the lower pelvis bilaterally, left greater than right. Moderate colonic stool burden without evidence of enteric obstruction. IMPRESSION: No definite displaced hip or pelvic fracture. Electronically Signed   By: Simonne Come M.D.   On: 03/05/2017 18:18    EKG: Independently reviewed. NSR.  No acute ST changes.  Assessment/Plan Principal Problem:   Hypernatremia Active Problems:   AKI (acute kidney injury) (HCC)   Schizophrenia (HCC)   Agitation   Falls   Anemia   Bipolar 1 disorder (HCC)   BPH (benign prostatic hyperplasia)   Secondary parkinsonism (HCC)      AKI with hypernatremia.  The patient does not appear to have lithium toxicity this time, but he is on a taper as recommended by his outpatient psychiatrist. --U/A dose not show infection --Check renal ultrasound for structural abnormality --Avoid hypotension --Avoid NSAIDs --Hydrate with D5 1/2NS for now --Check urine and serum osmolality --Strict I/O --Repeat BMP in the AM  Anemia, etiology unclear --Stool heme occult test --Anemia panel --Per caregiver, patient has had a screening colonoscopy since age 68; reportedly normal  Schizophrenia,  Bipolar, Mood disturbance --Continue home doses of klonopin, zyprexa, depakote --AVOID ativan as a prn; paradoxical response reported --Sitter ordered in the ED --He will need psych consult when medically cleared  Recent scalp laceration --Stitches due to be removed on Friday  BPH --Flomax  Secondary parkinson's --Sinemet   DVT prophylaxis: SCDs Code Status: FULL Family Communication: Caregiver present in the ED at time of admission. Disposition Plan: To be determined. Consults called: He will need psychiatry consult in the AM.  May need inpatient stay when medically cleared. Admission status: Place in observation, stepdown unit due to hypernatremia   TIME SPENT: 70 minutes   Jerene Bears MD [facility] Pager [phone]  If 7PM-7AM, please contact night-coverage [email or web address] Password [facility]  03/05/2017, 9:54 PM
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
## 2017-03-05 NOTE — Progress Notes (Signed)
CSW also called [facility] DSS Adult Protective Services Supervisor Virgel GessWendie Emerson [phone] and left VM stating he was unable to file a report and to please call the ED CSW back at [phone] in reference to the pt in 1241.  Dorothe PeaJonathan F. Ellianne Gowen, Francesco SorLCSWA, LCAS, CSI Clinical Social Worker Ph: [phone]

## 2017-03-05 NOTE — Care Management (Signed)
ED CM reviewed consult. CM to follow for discharge needs. Will also refer to CSW. Rubie Maidrystal Livan Hires RN CCM

## 2017-03-06 ENCOUNTER — Encounter: Payer: Self-pay | Admitting: Cardiology

## 2017-03-06 DIAGNOSIS — I4891 Unspecified atrial fibrillation: Secondary | ICD-10-CM | POA: Diagnosis not present

## 2017-03-06 DIAGNOSIS — D539 Nutritional anemia, unspecified: Secondary | ICD-10-CM | POA: Diagnosis present

## 2017-03-06 DIAGNOSIS — Z79899 Other long term (current) drug therapy: Secondary | ICD-10-CM | POA: Diagnosis not present

## 2017-03-06 DIAGNOSIS — R001 Bradycardia, unspecified: Secondary | ICD-10-CM | POA: Diagnosis present

## 2017-03-06 DIAGNOSIS — R4189 Other symptoms and signs involving cognitive functions and awareness: Secondary | ICD-10-CM | POA: Diagnosis present

## 2017-03-06 DIAGNOSIS — D649 Anemia, unspecified: Secondary | ICD-10-CM | POA: Diagnosis not present

## 2017-03-06 DIAGNOSIS — G4700 Insomnia, unspecified: Secondary | ICD-10-CM | POA: Diagnosis present

## 2017-03-06 DIAGNOSIS — F209 Schizophrenia, unspecified: Secondary | ICD-10-CM | POA: Diagnosis present

## 2017-03-06 DIAGNOSIS — Z7983 Long term (current) use of bisphosphonates: Secondary | ICD-10-CM | POA: Diagnosis not present

## 2017-03-06 DIAGNOSIS — I10 Essential (primary) hypertension: Secondary | ICD-10-CM | POA: Diagnosis present

## 2017-03-06 DIAGNOSIS — N400 Enlarged prostate without lower urinary tract symptoms: Secondary | ICD-10-CM | POA: Diagnosis present

## 2017-03-06 DIAGNOSIS — N251 Nephrogenic diabetes insipidus: Secondary | ICD-10-CM | POA: Diagnosis present

## 2017-03-06 DIAGNOSIS — M7989 Other specified soft tissue disorders: Secondary | ICD-10-CM | POA: Diagnosis not present

## 2017-03-06 DIAGNOSIS — G219 Secondary parkinsonism, unspecified: Secondary | ICD-10-CM | POA: Diagnosis present

## 2017-03-06 DIAGNOSIS — N179 Acute kidney failure, unspecified: Secondary | ICD-10-CM | POA: Diagnosis present

## 2017-03-06 DIAGNOSIS — I480 Paroxysmal atrial fibrillation: Secondary | ICD-10-CM | POA: Diagnosis present

## 2017-03-06 DIAGNOSIS — F79 Unspecified intellectual disabilities: Secondary | ICD-10-CM | POA: Diagnosis present

## 2017-03-06 DIAGNOSIS — E119 Type 2 diabetes mellitus without complications: Secondary | ICD-10-CM | POA: Diagnosis present

## 2017-03-06 DIAGNOSIS — E870 Hyperosmolality and hypernatremia: Secondary | ICD-10-CM | POA: Diagnosis present

## 2017-03-06 DIAGNOSIS — J42 Unspecified chronic bronchitis: Secondary | ICD-10-CM | POA: Diagnosis present

## 2017-03-06 DIAGNOSIS — R627 Adult failure to thrive: Secondary | ICD-10-CM | POA: Diagnosis present

## 2017-03-06 DIAGNOSIS — F4310 Post-traumatic stress disorder, unspecified: Secondary | ICD-10-CM | POA: Diagnosis present

## 2017-03-06 DIAGNOSIS — Z681 Body mass index (BMI) 19 or less, adult: Secondary | ICD-10-CM | POA: Diagnosis not present

## 2017-03-06 DIAGNOSIS — K219 Gastro-esophageal reflux disease without esophagitis: Secondary | ICD-10-CM | POA: Diagnosis present

## 2017-03-06 DIAGNOSIS — R296 Repeated falls: Secondary | ICD-10-CM | POA: Diagnosis present

## 2017-03-06 DIAGNOSIS — F319 Bipolar disorder, unspecified: Secondary | ICD-10-CM | POA: Diagnosis present

## 2017-03-06 DIAGNOSIS — R451 Restlessness and agitation: Secondary | ICD-10-CM | POA: Diagnosis not present

## 2017-03-06 DIAGNOSIS — M810 Age-related osteoporosis without current pathological fracture: Secondary | ICD-10-CM | POA: Diagnosis present

## 2017-03-06 DIAGNOSIS — Z810 Family history of intellectual disabilities: Secondary | ICD-10-CM | POA: Diagnosis not present

## 2017-03-06 LAB — BASIC METABOLIC PANEL
Anion gap: 6 (ref 5–15)
Anion gap: 5 (ref 5–15)
Anion gap: 2 — ABNORMAL LOW (ref 5–15)
BUN: 21 mg/dL — AB (ref 6–20)
BUN: 19 mg/dL (ref 6–20)
BUN: 17 mg/dL (ref 6–20)
CALCIUM: 7.9 mg/dL — AB (ref 8.9–10.3)
CALCIUM: 8 mg/dL — AB (ref 8.9–10.3)
CALCIUM: 7.2 mg/dL — AB (ref 8.9–10.3)
CHLORIDE: 127 mmol/L — AB (ref 101–111)
CO2: 25 mmol/L (ref 22–32)
CO2: 26 mmol/L (ref 22–32)
CO2: 24 mmol/L (ref 22–32)
CREATININE: 1.13 mg/dL (ref 0.61–1.24)
CREATININE: 1.09 mg/dL (ref 0.61–1.24)
CREATININE: 1.16 mg/dL (ref 0.61–1.24)
Chloride: 126 mmol/L — ABNORMAL HIGH (ref 101–111)
Chloride: 126 mmol/L — ABNORMAL HIGH (ref 101–111)
GFR calc Af Amer: >60 mL/min (ref >60)
GFR calc non Af Amer: >60 mL/min (ref >60)
GFR calc non Af Amer: >60 mL/min (ref >60)
GFR calc non Af Amer: >60 mL/min (ref >60)
GLUCOSE: 112 mg/dL — AB (ref 65–99)
Glucose, Bld: 96 mg/dL (ref 65–99)
Glucose, Bld: 89 mg/dL (ref 65–99)
Potassium: 3.7 mmol/L (ref 3.5–5.1)
Potassium: 3.8 mmol/L (ref 3.5–5.1)
Potassium: 3.8 mmol/L (ref 3.5–5.1)
Sodium: 158 mmol/L — ABNORMAL HIGH (ref 135–145)
Sodium: 157 mmol/L — ABNORMAL HIGH (ref 135–145)
Sodium: 152 mmol/L — ABNORMAL HIGH (ref 135–145)

## 2017-03-06 LAB — IRON AND TIBC
IRON: 44 ug/dL — AB (ref 45–182)
Saturation Ratios: 17 % — ABNORMAL LOW (ref 17.9–39.5)
TIBC: 259 ug/dL (ref 250–450)
UIBC: 215 ug/dL

## 2017-03-06 LAB — TSH: TSH: 3.874 u[IU]/mL (ref 0.350–4.500)

## 2017-03-06 LAB — OSMOLALITY: OSMOLALITY: 335 mosm/kg — AB (ref 275–295)

## 2017-03-06 LAB — CBC
HEMATOCRIT: 25.7 % — AB (ref 39.0–52.0)
Hemoglobin: 8 g/dL — ABNORMAL LOW (ref 13.0–17.0)
MCH: 33.8 pg (ref 26.0–34.0)
MCHC: 31.1 g/dL (ref 30.0–36.0)
MCV: 108.4 fL — ABNORMAL HIGH (ref 78.0–100.0)
Platelets: 143 10*3/uL — ABNORMAL LOW (ref 150–400)
RBC: 2.37 MIL/uL — ABNORMAL LOW (ref 4.22–5.81)
RDW: 16.9 % — AB (ref 11.5–15.5)
WBC: 6.3 10*3/uL (ref 4.0–10.5)

## 2017-03-06 LAB — TYPE AND SCREEN
ABO/RH(D): A POS
Antibody Screen: NEGATIVE

## 2017-03-06 LAB — MRSA PCR SCREENING: MRSA BY PCR: NEGATIVE

## 2017-03-06 LAB — FERRITIN: Ferritin: 265 ng/mL (ref 24–336)

## 2017-03-06 LAB — PROTIME-INR
INR: 1.15
Prothrombin Time: 14.8 seconds (ref 11.4–15.2)

## 2017-03-06 LAB — APTT: aPTT: 31 seconds (ref 24–36)

## 2017-03-06 LAB — ABO/RH: ABO/RH(D): A POS

## 2017-03-06 LAB — VITAMIN B12: VITAMIN B 12: 585 pg/mL (ref 180–914)

## 2017-03-06 LAB — FOLATE: FOLATE: 26 ng/mL (ref >5.9)

## 2017-03-06 LAB — OSMOLALITY, URINE: Osmolality, Ur: 259 mOsm/kg — ABNORMAL LOW (ref 300–900)

## 2017-03-06 MED ORDER — HYDROCHLOROTHIAZIDE 25 MG PO TABS
25.0000 mg | ORAL_TABLET | Freq: Two times a day (BID) | ORAL | Status: DC
  Administered 2017-03-06 – 2017-03-09 (×7): 25 mg via ORAL
  Filled 2017-03-06 (×7): qty 1

## 2017-03-06 MED ORDER — DEXTROSE 5 % IV SOLN
INTRAVENOUS | Status: AC
  Administered 2017-03-06: 15:00:00 via INTRAVENOUS
  Administered 2017-03-07: 1000 mL via INTRAVENOUS
  Administered 2017-03-07: 100 mL via INTRAVENOUS

## 2017-03-06 NOTE — Progress Notes (Signed)
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 -sodium on admission 158, serum osm 335, urine osm 259, , urine output 3.4liters since being admitted -free water deficit 5liters, he is started on D51/2ns on admission, sodium still elevated, change ivf to d5, nephrology consulted.  ARF: Likely prerenal Cr 1.39 on admission, cr improved to 1.09 after hydration  Macrocytic Anemia:  No acute bleed, anemia work up, no indication for transfusion  Schizophrenia, Bipolar, Mood disturbance --Continue home doses of klonopin, zyprexa, depakote (valproic acid elvel 67) --AVOID ativan as a prn; paradoxical response reported --Sitter  --psych consulted  Recent scalp laceration --Stitches due to be removed on Friday  BPH --Flomax  Secondary parkinson's --Sinemet  FTT; family report everytime he has a exacerbation of his psychiatric condition, he stopped walking, family report he has not walked for a week, will need PT once sodium improves.  Code Status: full  Family Communication: patient , sister over the phone  Disposition Plan: remain in stepdown   Consultants:  Nephrology  Procedures:  none  Antibiotics:  none   Objective: BP (!) 118/56   Pulse 64    Temp 98.8 F (37.1 C) (Axillary)   Resp 17   Ht 6\' 1"  (1.854 m)   Wt 68.5 kg (151 lb)   SpO2 100%   BMI 19.92 kg/m   Intake/Output Summary (Last 24 hours) at 03/06/17 1428 Last data filed at 03/06/17 1403  Gross per 24 hour  Intake          4706.67 ml  Output             3450 ml  Net          1256.67 ml   Filed Weights   03/05/17 2255  Weight: 68.5 kg (151 lb)    Exam:   General:  Confused, does not follow commands  Cardiovascular: RRR  Respiratory: CTABL  Abdomen: Soft/ND/NT, positive BS  Musculoskeletal: No Edema  Neuro: Confused, does not follow commands, moving all extremities spontaneously   Data Reviewed: Basic Metabolic Panel:  Recent Labs Lab 03/05/17 1814 03/05/17 2320 03/06/17 0331 03/06/17 0808  NA 158* 157* 158* 157*  K 4.0 4.0 3.7 3.8  CL 127* 126* 127* 126*  CO2 26 27 25 26   GLUCOSE 88 111* 96 89  BUN 30* 25* 21* 19  CREATININE 1.39* 1.28* 1.13 1.09  CALCIUM 8.1* 8.1* 7.9* 8.0*   Liver Function Tests:  Recent Labs Lab 03/05/17 1814  AST 35  ALT 39  ALKPHOS 79  BILITOT 0.6  PROT 5.8*  ALBUMIN 2.9*   No results for input(s): LIPASE, AMYLASE in the last 168 hours.  Recent Labs Lab 03/05/17 1815  AMMONIA 20   CBC:  Recent Labs Lab 03/05/17 1814 03/05/17 2320 03/06/17 0331  WBC 7.9  --  6.3  NEUTROABS 5.2  --   --   HGB 8.0* 8.2* 8.0*  HCT 25.8* 26.8* 25.7*  MCV 109.8*  --  108.4*  PLT 141*  --  143*   Cardiac Enzymes:   No results for input(s): CKTOTAL, CKMB, CKMBINDEX, TROPONINI in the last 168 hours. BNP (last 3 results) No results for input(s): BNP in the last 8760 hours.  ProBNP (last 3 results) No results for input(s): PROBNP in the last 8760 hours.  CBG: No results for input(s): GLUCAP in the last 168 hours.  Recent Results (from the past 240 hour(s))  MRSA PCR Screening     Status: None   Collection Time: 03/05/17 10:30 PM  Result Value Ref Range Status   MRSA by PCR NEGATIVE NEGATIVE Final     Comment:        The GeneXpert MRSA Assay (FDA approved for NASAL specimens only), is one component of a comprehensive MRSA colonization surveillance program. It is not intended to diagnose MRSA infection nor to guide or monitor treatment for MRSA infections.      Studies: Dg Chest 2 View  Result Date: 03/05/2017 CLINICAL DATA:  Altered mental status. Multiple falls. Aggressive behavior. EXAM: CHEST  2 VIEW COMPARISON:  Chest radiograph 08/08/2015. FINDINGS: The heart size and mediastinal contours are within normal limits. Both lungs are clear. Scattered granulomata. The visualized skeletal structures are unremarkable. IMPRESSION: No active cardiopulmonary disease.  Stable exam. Electronically Signed   By: Elsie Stain M.D.   On: 03/05/2017 18:20   Ct Head Wo Contrast  Result Date: 03/05/2017 CLINICAL DATA:  Multiple falls. EXAM: CT HEAD WITHOUT CONTRAST TECHNIQUE: Contiguous axial images were obtained from the base of the skull through the vertex without intravenous contrast. COMPARISON:  08/08/2015 FINDINGS: Brain: Similar findings of atrophy with sulcal prominence centralized volume loss. Scattered periventricular hypodensities compatible with microvascular ischemic disease. Given background parenchymal abnormalities, there is no CT evidence superimposed acute large territory infarct. No intraparenchymal or extra-axial mass or hemorrhage. Normal size and configuration of the ventricles and the basilar cisterns. No midline shift. Vascular: Intracranial atherosclerosis. Skull: No displaced calvarial fracture. Sinuses/Orbits: Limited visualization the paranasal sinuses and mastoid air cells is normal. No air-fluid levels. Other: Regional soft tissues appear normal. IMPRESSION: Similar findings of atrophy and microvascular ischemic disease without acute intracranial process. Electronically Signed   By: Simonne Come M.D.   On: 03/05/2017 17:43   US Renal  Result Date: 03/06/2017 CLINICAL DATA:   56 y/o  M; acute kidney injury. EXAM: RENAL / URINARY TRACT ULTRASOUND COMPLETE COMPARISON:  None. FINDINGS: Right Kidney: Length: 10.8 cm. Echogenicity within normal limits. No mass or hydronephrosis visualized. Left Kidney: Patient would not cooperate with examination, no acoustic window available. Bladder: Appears normal for degree of bladder distention. Bilateral renal jets noted. IMPRESSION: 1. Normal right kidney. 2. Left kidney could not be assessed.  See above. Electronically Signed   By: Mitzi Hansen M.D.   On: 03/06/2017 00:37   Dg Hip Unilat W Or Wo Pelvis 2-3 Views Left  Result Date: 03/05/2017 CLINICAL DATA:  Multiple falls recently. EXAM: DG HIP (WITH OR WITHOUT PELVIS) 2-3V LEFT COMPARISON:  None. FINDINGS: Examination is minimally degraded due to obliquity. No definite displaced hip or pelvic fracture. Left hip joint spaces appear preserved. No evidence avascular necrosis. Limited visualization the contralateral right hip is normal. Punctate phleboliths overlie the lower pelvis bilaterally, left greater than right. Moderate colonic stool burden without evidence of enteric obstruction. IMPRESSION: No definite displaced hip  or pelvic fracture. Electronically Signed   By: Simonne ComeJohn  Watts M.D.   On: 03/05/2017 18:18    Scheduled Meds: . aspirin EC  81 mg Oral Daily  . carbidopa-levodopa  1 tablet Oral TID  . clonazepam  0.25 mg Oral BID  . famotidine  20 mg Oral BID  . hydrochlorothiazide  25 mg Oral BID  . mouth rinse  15 mL Mouth Rinse BID  . [START ON 03/07/2017] medroxyPROGESTERone  150 mg Intramuscular Weekly  . OLANZapine zydis  5 mg Oral QHS  . pantoprazole  40 mg Oral Daily  . simvastatin  10 mg Oral QHS  . tamsulosin  0.4 mg Oral Daily  . Valproate Sodium  1,250 mg Oral QHS  . Valproate Sodium  750 mg Oral Daily    Continuous Infusions: . dextrose       Time spent: 35mins  Yuleni Burich MD, PhD  [facility] Pager [phone]. If 7PM-7AM, please contact  night-coverage at [email or web address], password [email or web address] 03/06/2017, 2:28 PM  LOS: 0 days
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
## 2017-03-06 NOTE — Progress Notes (Signed)
CRITICAL VALUE ALERT  Critical Value:  Serum osmol 335  Date & Time Notied:  03/06/2017 0758  Provider Notified: Roda ShuttersXu, MD   Orders Received/Actions taken: Increase IV fluids to 125 mls/hr. Strict I/O

## 2017-03-06 NOTE — Consult Note (Addendum)
Renal Service Consult Note [facility]  William [name] 03/06/2017 Avery D Requesting Physician:    Reason for Consult:  Hypernatremia HPI: The patient is a 56 y.o. year-old with history of bipolar, Parkinson's, DM2, Crohn's, BPH, ashtma, GERD, HL, HTN, mental retardation, chronic schizophrenia presented to ED on 6/27 yesterday with psychiatric relapse w/ odd behavior x 3-4 wks, hallucinating, not taking care of himself, etc.  ED labs showed high Na+ > 150, creat 1.4 so pt admitted for AKI and hypernatremia.  Lithium was reduced in past 1-2 yrs from 300 qid to 343m bid and now it has been dc'd altogether by pt's psychiatrist.  Head CT negative. NSAID's on hold for AKI, got IVF's and creat better but Na still high 157.  Asked to see for hypernatremia.    Home meds > asa, sinemet, klonopin, ensure, allegra, advil, MOM, zyprexa, prilosec, uristat, sudafed, zantac, zocor, depo-provera, fosamax, depakote, ultram, flomax   ROS  n/a pt not answering   Past Medical History  Past Medical History:  Diagnosis Date  . Asthma   . Bipolar affective disorder (HCC)    chronic  . BPH (benign prostatic hyperplasia)   . Chronic bronchitis ([place]   . Crohn's colitis ([place]   . Diabetes mellitus    Type II  . GERD (gastroesophageal reflux disease)   . Hypercholesteremia   . Hypertension   . Incontinence of feces   . Insomnia   . Mental disorder    bipolar  . Mental retardation   . Osteoporosis   . Parkinsonism, secondary ([place]   . Prostate hypertrophy   . PTSD (post-traumatic stress disorder)   . Schizophrenia, chronic condition ([facility]    Past Surgical History  Past Surgical History:  Procedure Laterality Date  . TONSILLECTOMY AND ADENOIDECTOMY     as child  . TOOTH EXTRACTION  10/22/2011   Procedure: DENTAL RESTORATION/EXTRACTIONS;  Surgeon: SGae Bon DDS;  [place]  Service: Oral Surgery;  Laterality: Bilateral;  . TRANSURETHRAL RESECTION OF PROSTATE      x 2   Family History  Family History  Problem Relation Age of Onset  . Diabetes Father   . Heart disease Father   . Diabetes Mother   . Heart disease Mother   . Dementia Mother    Social History  reports that he has never smoked. He does not have any smokeless tobacco history on file. He reports that he does not drink alcohol or use drugs. Allergies  Allergies  Allergen Reactions  . Bee Venom Anaphylaxis  . Nutritional Supplements Anaphylaxis    Care giver unsure if this is correct  . Latex Hives  . Penicillins Cross Reactors Hives and Rash  . Sulfa Drugs Cross Reactors Hives and Rash   Home medications Prior to Admission medications   Medication Sig Start Date End Date Taking? Authorizing Provider  acetaminophen (TYLENOL) 325 MG tablet Take 650 mg by mouth every 6 (six) hours as needed for moderate pain.   Yes [provider]  acetaminophen (TYLENOL) 500 MG tablet Take 1,000 mg by mouth every 6 (six) hours as needed for moderate pain.   Yes [provider]  aspirin EC 81 MG tablet Take 81 mg by mouth daily.   Yes [provider]  azelastine (ASTELIN) 0.1 % nasal spray Place 1 spray into both nostrils 2 (two) times daily as needed for rhinitis or allergies. Use in each nostril as directed   Yes [provider]  Benzocaine-Menthol (CEPACOL  SORE THROAT) 15-3.6 MG LOZG Use as directed 1 lozenge in the mouth or throat every 2 (two) hours as needed (sore throat).   Yes [provider]  bismuth subsalicylate (BISMATROL) 262 MG/15ML suspension Take 30 mLs by mouth every 6 (six) hours as needed.   Yes [provider]  Calamine 8-8 % LOTN Apply 1 application topically as needed (rash).   Yes [provider]  Calcium Carb-Cholecalciferol (CALCIUM-VITAMIN D) 600-400 MG-UNIT TABS Take 2 tablets by mouth daily.   Yes [provider]  carbidopa-levodopa (SINEMET IR) 25-100 MG per tablet Take 1 tablet by mouth 3 (three)  times daily.  04/10/15  Yes [provider]  clonazePAM (KLONOPIN) 0.25 MG disintegrating tablet Take 0.25 mg by mouth 2 (two) times daily.   Yes [provider]  clonazePAM (KLONOPIN) 0.5 MG tablet Take 0.5 mg by mouth 2 (two) times daily as needed for anxiety.   Yes [provider]  Dentifrices (SENSODYNE DT) Place onto teeth. Brush teeth twice a day.   Yes [provider]  diphenhydrAMINE (BENADRYL) spray Apply 1 application topically every 6 (six) hours as needed for itching.   Yes [provider]  ENSURE PLUS (ENSURE PLUS) LIQD Take 237 mLs by mouth 2 (two) times daily between meals.   Yes [provider]  fexofenadine (ALLEGRA) 180 MG tablet Take 180 mg by mouth daily as needed for allergies or rhinitis.   Yes [provider]  Glycerin (OASIS MOISTURIZING MOUTH SPRAY) 35 % LIQD Use as directed 2 sprays in the mouth or throat daily as needed (dry mouth).   Yes [provider]  guaifenesin (ROBAFEN) 100 MG/5ML syrup Take 200 mg by mouth 3 (three) times daily as needed for cough.   Yes [provider]  hydrocortisone 1 % lotion Apply 1 application topically 4 (four) times daily as needed for itching.   Yes [provider]  hydrocortisone 1 % ointment Apply 1 application topically 4 (four) times daily as needed for itching.   Yes [provider]  hydrocortisone cream 1 % Apply 1 application topically 4 (four) times daily as needed for itching.   Yes [provider]  ibuprofen (ADVIL,MOTRIN) 200 MG tablet Take 400 mg by mouth every 8 (eight) hours as needed for moderate pain.   Yes [provider]  magnesium hydroxide (MILK OF MAGNESIA) 400 MG/5ML suspension Take 30 mLs by mouth daily as needed for mild constipation.   Yes [provider]  Miconazole Nitrate (NEOSPORIN AF EX) Apply 1 application topically 3 (three) times daily as needed (minor cuts).   Yes [provider]  neomycin-bacitracin-polymyxin (NEOSPORIN) [phone] ointment Apply 1 application topically 3 (three) times daily as needed (minor cuts).   Yes [provider]  OLANZapine zydis (ZYPREXA) 5 MG disintegrating tablet Take 5 mg by mouth at bedtime.   Yes [provider]  OLANZapine zydis (ZYPREXA) 5 MG disintegrating tablet Take 5 mg by mouth 2 (two) times daily as needed (agitation).   Yes [provider]  omeprazole (PRILOSEC) 20 MG capsule Take 20 mg by mouth 2 (two) times daily.   Yes [provider]  phenazopyridine (URISTAT) 95 MG tablet Take 95 mg by mouth 3 (three) times daily as needed for pain.   Yes [provider]  phenol (CHLORASEPTIC) 1.4 % LIQD Use as directed 1 spray in the mouth or throat every 2 (two) hours as needed for throat irritation / pain.   Yes [provider]  povidone-iodine (BETADINE) 10 % external solution Apply 1 application topically as needed for wound care.   Yes [provider]  pseudoephedrine (SUDAFED) 30 MG tablet Take 30-60 mg by mouth every 4 (four) hours as needed for congestion.   Yes [provider]  ranitidine (ZANTAC) 150 MG tablet Take 150 mg by mouth daily.  01/08/15  Yes [provider]  Selenium Sulfide (SELSUN BLUE EX) Apply 1 application topically daily. Shampoo once a day   Yes [provider]  simvastatin (ZOCOR) 10 MG tablet Take 10 mg by mouth at bedtime.   Yes [provider]  Sunscreens (COPPERTONE KIDS AST41 EX) Apply 1 application topically daily as needed (prevent sunburn).   Yes [provider]  tamsulosin (FLOMAX) 0.4 MG CAPS capsule Take 0.4 mg by mouth daily.   Yes [provider]  tolnaftate (TINACTIN) 1 % powder Apply 1 application topically daily as needed for itching.   Yes [provider]  tolnaftate (TINACTIN) 1 % spray Apply 1 spray topically daily as needed (itching).   Yes [provider]   traMADol (ULTRAM) 50 MG tablet Take 50 mg by mouth 3 (three) times daily as needed for moderate pain or severe pain.   Yes [provider]  Valproic Acid (DEPAKENE) 250 MG/5ML SYRP syrup Take 1,250 mg by mouth at bedtime.    Yes [provider]  Valproic Acid (DEPAKENE) 250 MG/5ML SYRP syrup Take 750 mg by mouth every morning.    Yes [provider]  Wheat Dextrin (BENEFIBER PO) Take 15 mLs by mouth daily as needed (constipation).   Yes [provider]  alendronate (FOSAMAX) 70 MG tablet Take 70 mg by mouth once a week.  01/12/15   [provider]  EPINEPHrine (EPI-PEN) 0.3 mg/0.3 mL DEVI Inject 0.3 mg into the muscle once. For allergic reaction to bees    [provider]  medroxyPROGESTERone (DEPO-PROVERA) 150 MG/ML injection Inject 150 mg into the muscle once a week. On fridays    [provider]   Liver Function Tests  Recent Labs Lab 03/05/17 1814  AST 35  ALT 39  ALKPHOS 79  BILITOT 0.6  PROT 5.8*  ALBUMIN 2.9*   No results for input(s): LIPASE, AMYLASE in the last 168 hours. CBC  Recent Labs Lab 03/05/17 1814 03/05/17 2320 03/06/17 0331  WBC 7.9  --  6.3  NEUTROABS 5.2  --   --   HGB 8.0* 8.2* 8.0*  HCT 25.8* 26.8* 25.7*  MCV 109.8*  --  108.4*  PLT 141*  --  962*   Basic Metabolic Panel  Recent Labs Lab 03/05/17 1814 03/05/17 2320 03/06/17 0331 03/06/17 0808  NA 158* 157* 158* 157*  K 4.0 4.0 3.7 3.8  CL 127* 126* 127* 126*  CO2 '26 27 25 26  ' GLUCOSE 88 111* 96 89  BUN 30* 25* 21* 19  CREATININE 1.39* 1.28* 1.13 1.09  CALCIUM 8.1* 8.1* 7.9* 8.0*   Iron/TIBC/Ferritin/ %Sat    Component Value Date/Time   IRON 44 (L) 03/05/2017 2320   TIBC 259 03/05/2017 2320   FERRITIN 265 03/05/2017 2320   IRONPCTSAT 17 (L) 03/05/2017 2320    Vitals:   03/06/17 0500 03/06/17 0600 03/06/17 0800 03/06/17 1137  BP:  120/66 (!) 118/56   Pulse: 72 69 64   Resp: '10 17 17   ' Temp:   98.3 F (36.8 C) 98.8 F  (37.1 C)  TempSrc:   Oral Axillary  SpO2: 100% 98% 100%  Weight:      Height:       Exam Gen lethargic, sedated, minimal verbal response No rash, cyanosis or gangrene Sclera anicteric, throat clear  No jvd or bruits Chest clear bilat RRR no MRG Abd soft ntnd no mass or ascites +bs GU normal male MS no joint effusions or deformity Ext trace LE edema / no wounds or ulcers Neuro is lethargic, stiff extremities, confused and not responding verbally   Na 158  K 3.8  BUN 19  Cr 1.09   eGFR > 60  WBC 6k  Hb 8 UA 1.010, pH 8.0    Assess: 1  Hypernatremia - no old records here, but this is likely not new.  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  Would not do any diagnostic testing but can start him on HCTZ (25 mg , titrate to 25 bid) which will lower blood volume and decrease urine flow thereby improving polyuria, hopefully.  NSAID's and desmopressin may be tried if not responding to HCTZ.  Will follow.   2  Bipolar/ 2nd Parkinson's/ schizophrenia 3  Volume - looks euvolemic 4  Delirium - prob psych related    Plan - as above  Kelly Splinter MD [place] pager [phone]   03/06/2017, 1:57 PM

## 2017-03-06 NOTE — Progress Notes (Signed)
Patient ID: William [name], male   DOB: 11/03/1960, 56 y.o.   MRN: [id number]  Received psychiatric consultation for combative behaviors of this 56 years old male with a schizophrenia. Patient was extremely sedated with his current medication given this morning and sleeping without any distress. Patient safety sister reported patient does not talk much and and is not a good historian.  Psychiatric consultation and will try to follow up with him while he is awake and also asked CSW to contact family members for the collateral information. Reviewed the information documented and agree with the treatment plan.  [name] [facility] Hospital[facility]ON[facility]ADDA 03/06/2017 4:26 PM

## 2017-03-06 NOTE — Progress Notes (Signed)
Nutrition Brief Note  Patient identified on the Malnutrition Screening Tool (MST) Report  Wt Readings from Last 15 Encounters:  03/05/17 151 lb (68.5 kg)  04/12/15 156 lb 12.8 oz (71.1 kg)  01/19/15 158 lb 9.6 oz (71.9 kg)  10/21/11 191 lb 12.8 oz (87 kg)    Body mass index is 19.92 kg/m. Patient meets criteria for normal weight based on current BMI. Per chart review, pt [facility] 147 lbs on 01/27/17 [facility]. Skin WDL. Pt with hx of cognitive impairment, schizophrenia, bipolar disorder, HTN, BPH, secondary Parkinson's, and self-destructive behaviors. He was admitted for evaluation of AKI with hypernatremia.   Current diet order is Regular and pt consumed 100% of breakfast this AM (640 kcal and 18 grams of protein). Labs and medications reviewed. No nutrition interventions warranted at this time. If nutrition issues arise, please consult RD.    Trenton GammonJessica Catherina Pates, MS, RD, LDN, [facility] Inpatient Clinical Dietitian Pager # [phone] After hours/weekend pager # [phone]

## 2017-03-06 NOTE — Progress Notes (Signed)
Pt from foster home and has [facility] home health services for PT/OT. [facility] rep alerted of pt admission. Will need MD orders if continued home health is needed. CM will continue to follow.  Sandford Crazeora Stephanieann Popescu RN,BSN,NCM [phone]

## 2017-03-07 ENCOUNTER — Encounter (HOSPITAL_COMMUNITY): Payer: Self-pay

## 2017-03-07 LAB — BASIC METABOLIC PANEL
ANION GAP: 4 — AB (ref 5–15)
BUN: 15 mg/dL (ref 6–20)
CHLORIDE: 118 mmol/L — AB (ref 101–111)
CO2: 26 mmol/L (ref 22–32)
CREATININE: 1.29 mg/dL — AB (ref 0.61–1.24)
Calcium: 7.6 mg/dL — ABNORMAL LOW (ref 8.9–10.3)
GFR calc Af Amer: >60 mL/min (ref >60)
GFR calc non Af Amer: >60 mL/min (ref >60)
Glucose, Bld: 107 mg/dL — ABNORMAL HIGH (ref 65–99)
POTASSIUM: 4.5 mmol/L (ref 3.5–5.1)
SODIUM: 148 mmol/L — AB (ref 135–145)

## 2017-03-07 LAB — RPR: RPR Ser Ql: NONREACTIVE

## 2017-03-07 LAB — HIV ANTIBODY (ROUTINE TESTING W REFLEX): HIV Screen 4th Generation wRfx: NONREACTIVE

## 2017-03-07 MED ORDER — METOPROLOL TARTRATE 12.5 MG HALF TABLET
12.5000 mg | ORAL_TABLET | Freq: Two times a day (BID) | ORAL | Status: DC
  Administered 2017-03-07 – 2017-03-10 (×4): 12.5 mg via ORAL
  Filled 2017-03-07 (×5): qty 1

## 2017-03-07 MED ORDER — LACTATED RINGERS IV BOLUS (SEPSIS)
1000.0000 mL | Freq: Once | INTRAVENOUS | Status: AC
  Administered 2017-03-07: 1000 mL via INTRAVENOUS

## 2017-03-07 MED ORDER — DEXTROSE-NACL 5-0.45 % IV SOLN
INTRAVENOUS | Status: AC
  Administered 2017-03-07: 1000 mL via INTRAVENOUS

## 2017-03-07 NOTE — Progress Notes (Signed)
Telemetry showing a fib. MD notified. EKG performed. MD notified of results.

## 2017-03-07 NOTE — Progress Notes (Addendum)
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 -sodium on admission 158, serum osm 335, urine osm 259, , urine output 4.9liters last 24hrs -he is started on D51/2ns on admission, sodium still elevated, change ivf to d5, nephrology consulted. -he is started on hctz, sodium improving. ivf changed to d51/2ns  ARF: Likely prerenal Cr 1.39 on admission, cr improved to 1.09 after hydration, then increased again, likely due to significant amount of urine output,  Renal us suboptimal due to patient not able to cooperate with the exam ivf bolus with LR one liter  Macrocytic Anemia:  No acute bleed, anemia work up, no indication for transfusion  Schizophrenia, Bipolar, Mood disturbance --Continue home doses of klonopin, zyprexa, depakote (valproic acid elvel 67) --AVOID ativan as a prn; paradoxical response reported --Sitter  --psych consulted  Recent scalp laceration --Stitches due to be removed on Friday  BPH --Flomax  Secondary parkinson's --Sinemet  FTT; family report everytime he has a exacerbation of his psychiatric condition, he stopped walking, family reports he has not walked for a week, will need PT once sodium improves.  5:15pm Addendum:  Tele showed  afib, rate in the 90's, bp stable, no prior diagnosis of aifb, start low dose lopressor with holding parameters, chads2vasc score of 0-1, there is documented  h/o diabetes though he is not on any meds for this and no hyperglycemia in the hospital.  He is not a candidate for anticoagulation anyway due to h/o frequent falls, continue asa 81mg , will get echocardiogram if patient will be cooperative with the exam. Will keep k>4, mag > 2, tsh wnl.  Code Status: full  Family Communication: patient , sister over the phone  Disposition Plan: sodium is improving, transfer to med surg  Return to group home vs psych facility placement after medical cleared.  Consultants:  Nephrology  psychiatry  Procedures:  none  Antibiotics:  none   Objective: BP 103/65   Pulse 62   Temp 99.1 F (37.3 C) (Axillary)   Resp 17   Ht 6\' 1"  (1.854 m)   Wt 68.5 kg (151 lb)   SpO2 98%   BMI 19.92 kg/m   Intake/Output Summary (Last 24 hours) at 03/07/17 0726 Last data filed at 03/07/17 0600  Gross per 24 hour  Intake          4978.75 ml  Output             4900 ml  Net            78.75 ml   Filed Weights   03/05/17 2255  Weight: 68.5 kg (151 lb)    Exam:   General:  Confused, does not follow commands  Cardiovascular: RRR  Respiratory: CTABL  Abdomen: Soft/ND/NT, positive BS  Musculoskeletal: No Edema  Neuro: Confused, does not follow commands, moving  all extremities spontaneously   Data Reviewed: Basic Metabolic Panel:  Recent Labs Lab 03/05/17 2320 03/06/17 0331 03/06/17 0808 03/06/17 1445 03/07/17 0336  NA 157* 158* 157* 152* 148*  K 4.0 3.7 3.8 3.8 4.5  CL 126* 127* 126* 126* 118*  CO2 27 25 26 24 26   GLUCOSE 111* 96 89 112* 107*  BUN 25* 21* 19 17 15   CREATININE 1.28* 1.13 1.09 1.16 1.29*  CALCIUM 8.1* 7.9* 8.0* 7.2* 7.6*   Liver Function Tests:  Recent Labs Lab 03/05/17 1814  AST 35  ALT 39  ALKPHOS 79  BILITOT 0.6  PROT 5.8*  ALBUMIN 2.9*   No  results for input(s): LIPASE, AMYLASE in the last 168 hours.  Recent Labs Lab 03/05/17 1815  AMMONIA 20   CBC:  Recent Labs Lab 03/05/17 1814 03/05/17 2320 03/06/17 0331  WBC 7.9  --  6.3  NEUTROABS 5.2  --   --   HGB 8.0* 8.2* 8.0*  HCT 25.8* 26.8* 25.7*  MCV 109.8*  --  108.4*  PLT 141*  --  143*   Cardiac Enzymes:   No results for input(s): CKTOTAL, CKMB, CKMBINDEX, TROPONINI in the last 168 hours. BNP (last 3 results) No results for input(s): BNP in the last 8760 hours.  ProBNP (last 3 results) No results for input(s): PROBNP in the last 8760 hours.  CBG: No results for input(s): GLUCAP in the last 168 hours.  Recent Results (from the past 240 hour(s))  MRSA PCR Screening     Status: None   Collection Time: 03/05/17 10:30 PM  Result Value Ref Range Status   MRSA by PCR NEGATIVE NEGATIVE Final    Comment:        The GeneXpert MRSA Assay (FDA approved for NASAL specimens only), is one component of a comprehensive MRSA colonization surveillance program. It is not intended to diagnose MRSA infection nor to guide or monitor treatment for MRSA infections.      Studies: No results found.  Scheduled Meds: . aspirin EC  81 mg Oral Daily  . carbidopa-levodopa  1 tablet Oral TID  . clonazepam  0.25 mg Oral BID  . famotidine  20 mg Oral BID  . hydrochlorothiazide  25 mg Oral BID  . mouth rinse  15 mL Mouth Rinse BID  . medroxyPROGESTERone  150 mg Intramuscular Weekly  . OLANZapine zydis  5 mg Oral QHS  . pantoprazole  40 mg Oral Daily  . simvastatin  10 mg Oral QHS  . tamsulosin  0.4 mg Oral Daily  . Valproate Sodium  1,250 mg Oral QHS  . Valproate Sodium  750 mg Oral Daily    Continuous Infusions: . dextrose 100 mL (03/07/17 0030)  . dextrose 5 % and 0.45% NaCl    . lactated ringers       Time spent:  Ignace Mandigo MD, PhD  [facility] Pager [phone]. If 7PM-7AM, please contact night-coverage at [email or web address], password [facility] 03/07/2017,  7:26 AM  LOS: 1 day

## 2017-03-07 NOTE — Plan of Care (Signed)
Problem: Safety: Goal: Ability to remain free from injury will improve Outcome: Progressing William [name] will remain free from injury from falls while in hospital..  Problem: Physical Regulation: Goal: Ability to maintain clinical measurements within normal limits will improve Outcome: Progressing Monitoring pain levels and addressing needs  Problem: Skin Integrity: Goal: Risk for impaired skin integrity will decrease Outcome: Progressing William [name] will change positions every 2 hrs to improve skin condition  Problem: Tissue Perfusion: Goal: Risk factors for ineffective tissue perfusion will decrease Outcome: Progressing William [name] will maintain normal levels in vital signs.  Problem: Fluid Volume: Goal: Ability to maintain a balanced intake and output will improve Outcome: Progressing William [name] will have normal output and intake, measuring closely as we pull fluid off

## 2017-03-07 NOTE — Progress Notes (Signed)
[facility] Progress Note  Subjective: more responsive today and more awake  Vitals:   03/07/17 0900 03/07/17 1000 03/07/17 1100 03/07/17 1200  BP:  (!) 112/58  129/70  Pulse: 66   68  Resp: (!) 22 (!) 22 (!) 22 15  Temp:      TempSrc:      SpO2: 98%   100%  Weight:      Height:        Inpatient medications: . aspirin EC  81 mg Oral Daily  . carbidopa-levodopa  1 tablet Oral TID  . clonazepam  0.25 mg Oral BID  . famotidine  20 mg Oral BID  . hydrochlorothiazide  25 mg Oral BID  . mouth rinse  15 mL Mouth Rinse BID  . medroxyPROGESTERone  150 mg Intramuscular Weekly  . OLANZapine zydis  5 mg Oral QHS  . pantoprazole  40 mg Oral Daily  . simvastatin  10 mg Oral QHS  . tamsulosin  0.4 mg Oral Daily  . Valproate Sodium  1,250 mg Oral QHS  . Valproate Sodium  750 mg Oral Daily   . dextrose Stopped (03/07/17 1135)  . dextrose 5 % and 0.45% NaCl 1,000 mL (03/07/17 1135)  . 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  Lithium dc'd altogether now. Getting D5W and Na down today 148.  Cont D5W.  Getting HCTZ as 1st line treatment option for NDI (acts as diuretic and decreases blood volume and therefore UOP ideally). Repeat Na in am.  2  Bipolar/ 2nd Parkinson's/ schizophrenia 3  Volume - looks euvolemic 4  Delirium - prob psych related   Plan - as above  Vinson Moselleob Tasheema Perrone MD [facility] pager [phone]   03/07/2017, 1:01 PM    Recent Labs Lab 03/06/17 0808 03/06/17 1445 03/07/17 0336  NA 157* 152* 148*  K 3.8 3.8 4.5   CL 126* 126* 118*  CO2 26 24 26   GLUCOSE 89 112* 107*  BUN 19 17 15   CREATININE 1.09 1.16 1.29*  CALCIUM 8.0* 7.2* 7.6*    Recent Labs Lab 03/05/17 1814  AST 35  ALT 39  ALKPHOS 79  BILITOT 0.6  PROT 5.8*  ALBUMIN 2.9*    Recent Labs Lab 03/05/17 1814 03/05/17 2320 03/06/17 0331  WBC 7.9  --  6.3  NEUTROABS 5.2  --   --   HGB 8.0* 8.2* 8.0*  HCT 25.8* 26.8* 25.7*  MCV 109.8*  --  108.4*  PLT 141*  --  143*   Iron/TIBC/Ferritin/ %Sat    Component Value Date/Time   IRON 44 (L) 03/05/2017 2320   TIBC 259 03/05/2017 2320   FERRITIN 265 03/05/2017 2320   IRONPCTSAT 17 (L) 03/05/2017 2320

## 2017-03-07 NOTE — Progress Notes (Signed)
Sande RivesKaren Shaver from [facility] [phone]( [phone] ) [facility].  Cori RazorJamie Lyriq Jarchow LCSW [phone]

## 2017-03-08 ENCOUNTER — Inpatient Hospital Stay (HOSPITAL_COMMUNITY): Payer: Medicare Other

## 2017-03-08 DIAGNOSIS — I4891 Unspecified atrial fibrillation: Secondary | ICD-10-CM

## 2017-03-08 LAB — BASIC METABOLIC PANEL
Anion gap: 6 (ref 5–15)
BUN: 18 mg/dL (ref 6–20)
CO2: 24 mmol/L (ref 22–32)
CREATININE: 1.28 mg/dL — AB (ref 0.61–1.24)
Calcium: 7.3 mg/dL — ABNORMAL LOW (ref 8.9–10.3)
Chloride: 113 mmol/L — ABNORMAL HIGH (ref 101–111)
GFR calc Af Amer: >60 mL/min (ref >60)
Glucose, Bld: 93 mg/dL (ref 65–99)
Potassium: 3.8 mmol/L (ref 3.5–5.1)
SODIUM: 143 mmol/L (ref 135–145)

## 2017-03-08 LAB — VITAMIN B1: VITAMIN B1 (THIAMINE): 175.3 nmol/L (ref 66.5–200.0)

## 2017-03-08 LAB — MAGNESIUM: Magnesium: 2.2 mg/dL (ref 1.7–2.4)

## 2017-03-08 LAB — ECHOCARDIOGRAM COMPLETE
Height: 73 in
WEIGHTICAEL: 2416 [oz_av]

## 2017-03-08 MED ORDER — LACTATED RINGERS IV SOLN
INTRAVENOUS | Status: AC
  Administered 2017-03-08: 15:00:00 via INTRAVENOUS

## 2017-03-08 MED ORDER — DEXTROSE-NACL 5-0.45 % IV SOLN
INTRAVENOUS | Status: DC
Start: 2017-03-08 — End: 2017-03-08

## 2017-03-08 MED ORDER — SENNOSIDES-DOCUSATE SODIUM 8.6-50 MG PO TABS
1.0000 | ORAL_TABLET | Freq: Every day | ORAL | Status: DC
  Administered 2017-03-08: 1 via ORAL
  Filled 2017-03-08: qty 1

## 2017-03-08 NOTE — Progress Notes (Addendum)
PROGRESS NOTE  Rogue Jurydwin A Beckers ZOX:[id number] DOB: 03/01/1961 DOA: 03/05/2017 PCP: [name], Lawrence Edward, MD  HPI/Recap of past 24 hours:  Improving, urine output seems to have slowed down, he is more alert , now does attempt to follow commands, and ate very well this morning ( he needs to be fed)  Assessment/Plan: Principal Problem:   Hypernatremia Active Problems:   AKI (acute kidney injury) (HCC)   Schizophrenia (HCC)   Agitation   Falls   Anemia   Bipolar 1 disorder (HCC)   BPH (benign prostatic hyperplasia)   Secondary parkinsonism (HCC)  Hypernatremia: -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 -sodium on admission 158, serum osm 335, urine osm 259, , urine output 4.9liters last 24hrs -he is started on D51/2ns on admission, sodium still elevated, change ivf to d5,  - nephrology consulted.he is started on hctz, sodium normalized   ARF: -Likely prerenal -Cr 1.39 on admission, cr improved to 1.09 after hydration, then increased again, likely due to significant amount of urine output,  -Renal us suboptimal due to patient not able to cooperate with the exam -ivf with LR , repeat bmp in am, consider d/c hctz if sodium continue to be stable in am.  Macrocytic Anemia:  No acute bleed, anemia work up, no indication for transfusion  Schizophrenia, Bipolar, Mood disturbance --Continue home doses of klonopin, zyprexa, depakote (valproic acid elvel 67) --AVOID ativan as a prn; paradoxical response reported --Sitter  --psych consulted  Recent scalp laceration --Stitches removed on Friday  BPH --Flomax  Secondary parkinson's --Sinemet  FTT; family report everytime he has a exacerbation of his psychiatric condition,  he stopped walking, family reports he has not walked for a week, will need PT once sodium improves.   Paroxysmal afib, with tendency to have bradycardia in the 50's   -bp stable, no prior diagnosis of aifb,  -start low dose lopressor with holding parameters,  -chads2vasc score of 0-1, there is documented  h/o diabetes though he is not on any meds for this and no hyperglycemia in the hospital.  -He is not a candidate for anticoagulation anyway due to h/o frequent falls, continue asa 81mg ,   -echocardiogram "Mild LVH with LVEF 55-60% and grossly normal diastolic function.   Mildly calcified mitral annulus with trivial mitral   regurgitation. Mildly dilated aortic root. Sclerotic aortic valve   with mild aortic regurgitation. Mild tricuspid regurgitation with   PASP estimated 22 mmHg. -tsh wnl. Will keep k>4, mag > 2,   Code Status: full  Family Communication: patient , sister over the phone  Disposition Plan: sodium is improving, transfer to med surg  Return to group home vs psych facility placement after medical cleared.  Consultants:  Nephrology  psychiatry  Procedures:  none  Antibiotics:  none   Objective: BP (!) 95/52 (BP Location: Left Arm) Comment: nurse notified   Pulse (!) 116 Comment: nurse notified  Temp 99 F (37.2 C) (Oral)   Resp 18   Ht 6\' 1"  (1.854 m)   Wt 68.5 kg (151 lb)   SpO2 100%   BMI 19.92 kg/m   Intake/Output Summary (Last 24 hours) at 03/08/17 0746 Last data filed at 03/08/17 0533  Gross per 24 hour  Intake  2403.33 ml  Output             3450 ml  Net         -1046.67 ml   Filed Weights   03/05/17 2255  Weight: 68.5 kg (151 lb)    Exam:   General:  Seems has improved, he started to make eye contact, attempt to follow commands  Cardiovascular: RRR  Respiratory: CTABL  Abdomen: Soft/ND/NT, positive BS  Musculoskeletal: No Edema  Neuro: Seems has improved, he started to make eye contact, attempt to follow  commands,moving all extremities spontaneously   Data Reviewed: Basic Metabolic Panel:  Recent Labs Lab 03/05/17 2320 03/06/17 0331 03/06/17 0808 03/06/17 1445 03/07/17 0336  NA 157* 158* 157* 152* 148*  K 4.0 3.7 3.8 3.8 4.5  CL 126* 127* 126* 126* 118*  CO2 27 25 26 24 26   GLUCOSE 111* 96 89 112* 107*  BUN 25* 21* 19 17 15   CREATININE 1.28* 1.13 1.09 1.16 1.29*  CALCIUM 8.1* 7.9* 8.0* 7.2* 7.6*   Liver Function Tests:  Recent Labs Lab 03/05/17 1814  AST 35  ALT 39  ALKPHOS 79  BILITOT 0.6  PROT 5.8*  ALBUMIN 2.9*   No results for input(s): LIPASE, AMYLASE in the last 168 hours.  Recent Labs Lab 03/05/17 1815  AMMONIA 20   CBC:  Recent Labs Lab 03/05/17 1814 03/05/17 2320 03/06/17 0331  WBC 7.9  --  6.3  NEUTROABS 5.2  --   --   HGB 8.0* 8.2* 8.0*  HCT 25.8* 26.8* 25.7*  MCV 109.8*  --  108.4*  PLT 141*  --  143*   Cardiac Enzymes:   No results for input(s): CKTOTAL, CKMB, CKMBINDEX, TROPONINI in the last 168 hours. BNP (last 3 results) No results for input(s): BNP in the last 8760 hours.  ProBNP (last 3 results) No results for input(s): PROBNP in the last 8760 hours.  CBG: No results for input(s): GLUCAP in the last 168 hours.  Recent Results (from the past 240 hour(s))  MRSA PCR Screening     Status: None   Collection Time: 03/05/17 10:30 PM  Result Value Ref Range Status   MRSA by PCR NEGATIVE NEGATIVE Final    Comment:        The GeneXpert MRSA Assay (FDA approved for NASAL specimens only), is one component of a comprehensive MRSA colonization surveillance program. It is not intended to diagnose MRSA infection nor to guide or monitor treatment for MRSA infections.      Studies: No results found.  Scheduled Meds: . aspirin EC  81 mg Oral Daily  . carbidopa-levodopa  1 tablet Oral TID  . clonazepam  0.25 mg Oral BID  . famotidine  20 mg Oral BID  . hydrochlorothiazide  25 mg Oral BID  . mouth rinse  15 mL Mouth Rinse BID    . medroxyPROGESTERone  150 mg Intramuscular Weekly  . metoprolol tartrate  12.5 mg Oral BID  . OLANZapine zydis  5 mg Oral QHS  . pantoprazole  40 mg Oral Daily  . simvastatin  10 mg Oral QHS  . tamsulosin  0.4 mg Oral Daily  . Valproate Sodium  1,250 mg Oral QHS  . Valproate Sodium  750 mg Oral Daily    Continuous Infusions: . dextrose 5 % and 0.45% NaCl       Time spent:  Malinda Mayden MD, PhD  [facility] Pager [phone]. If 7PM-7AM, please contact night-coverage at [email or web address], password [facility] 03/08/2017, 7:46  AM  LOS: 2 days

## 2017-03-08 NOTE — Progress Notes (Signed)
[facility] Progress Note  Subjective: more responsive again today; eating now, but has to be fed  Vitals:   03/07/17 1506 03/07/17 2042 03/08/17 0500 03/08/17 1300  BP: 111/68 (!) 95/55 (!) 95/52 (!) 94/58  Pulse: 64 92 (!) 116 (!) 57  Resp: 18 18 18 18   Temp: 98.2 F (36.8 C) 97.5 F (36.4 C) 99 F (37.2 C) 98.2 F (36.8 C)  TempSrc: Oral Oral Oral Oral  SpO2: 100% 95% 100% 100%  Weight:    70 kg (154 lb 5.2 oz)  Height:        Inpatient medications: . aspirin EC  81 mg Oral Daily  . carbidopa-levodopa  1 tablet Oral TID  . clonazepam  0.25 mg Oral BID  . famotidine  20 mg Oral BID  . hydrochlorothiazide  25 mg Oral BID  . mouth rinse  15 mL Mouth Rinse BID  . medroxyPROGESTERone  150 mg Intramuscular Weekly  . metoprolol tartrate  12.5 mg Oral BID  . OLANZapine zydis  5 mg Oral QHS  . pantoprazole  40 mg Oral Daily  . senna-docusate  1 tablet Oral QHS  . simvastatin  10 mg Oral QHS  . tamsulosin  0.4 mg Oral Daily  . Valproate Sodium  1,250 mg Oral QHS  . Valproate Sodium  750 mg Oral Daily   . 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  Lithium dc'd altogether now. Na down to 142.  Started HCTZ at 25 mg bid for nephrogenic DI.  One primary issue will be does patient have capacity to drink and also the availability to get water when he leaves here ?  Also he has severe psych disease and feel palliative care consult w/ limits to care would be advisable. No other suggestions.  Will sign off.  2  Bipolar/ parkinson's/  schizophrenia 3  Volume - looks euvolemic 4  Delirium - prob psych related   Plan - as above  Vinson Moselleob Cabrini Ruggieri MD [facility] pager [phone]   03/08/2017, 4:22 PM    Recent Labs Lab 03/06/17 1445 03/07/17 0336 03/08/17 0816  NA 152* 148* 143  K 3.8 4.5 3.8  CL 126* 118* 113*  CO2 24 26 24   GLUCOSE 112* 107* 93  BUN 17 15 18   CREATININE 1.16 1.29* 1.28*  CALCIUM 7.2* 7.6* 7.3*    Recent Labs Lab 03/05/17 1814  AST 35  ALT 39  ALKPHOS 79  BILITOT 0.6  PROT 5.8*  ALBUMIN 2.9*    Recent Labs Lab 03/05/17 1814 03/05/17 2320 03/06/17 0331  WBC 7.9  --  6.3  NEUTROABS 5.2  --   --   HGB 8.0* 8.2* 8.0*  HCT 25.8* 26.8* 25.7*  MCV 109.8*  --  108.4*  PLT 141*  --  143*   Iron/TIBC/Ferritin/ %Sat    Component Value Date/Time   IRON 44 (L) 03/05/2017 2320   TIBC 259 03/05/2017 2320   FERRITIN 265 03/05/2017 2320   IRONPCTSAT 17 (L) 03/05/2017 2320

## 2017-03-08 NOTE — Progress Notes (Signed)
  Echocardiogram 2D Echocardiogram has been performed.  Leta JunglingCooper, Keshaun Dubey M 03/08/2017, 8:20 AM

## 2017-03-09 DIAGNOSIS — Z810 Family history of intellectual disabilities: Secondary | ICD-10-CM

## 2017-03-09 DIAGNOSIS — F209 Schizophrenia, unspecified: Secondary | ICD-10-CM

## 2017-03-09 DIAGNOSIS — G219 Secondary parkinsonism, unspecified: Secondary | ICD-10-CM

## 2017-03-09 DIAGNOSIS — N400 Enlarged prostate without lower urinary tract symptoms: Secondary | ICD-10-CM

## 2017-03-09 LAB — BASIC METABOLIC PANEL
Anion gap: 8 (ref 5–15)
BUN: 18 mg/dL (ref 6–20)
CALCIUM: 7.6 mg/dL — AB (ref 8.9–10.3)
CHLORIDE: 109 mmol/L (ref 101–111)
CO2: 26 mmol/L (ref 22–32)
CREATININE: 1.04 mg/dL (ref 0.61–1.24)
GFR calc non Af Amer: >60 mL/min (ref >60)
Glucose, Bld: 101 mg/dL — ABNORMAL HIGH (ref 65–99)
Potassium: 3.5 mmol/L (ref 3.5–5.1)
SODIUM: 143 mmol/L (ref 135–145)

## 2017-03-09 LAB — MAGNESIUM: MAGNESIUM: 2.4 mg/dL (ref 1.7–2.4)

## 2017-03-09 MED ORDER — POLYETHYLENE GLYCOL 3350 17 G PO PACK
17.0000 g | PACK | Freq: Every day | ORAL | Status: DC
  Administered 2017-03-09 – 2017-03-10 (×2): 17 g via ORAL
  Filled 2017-03-09 (×2): qty 1

## 2017-03-09 MED ORDER — LACTATED RINGERS IV BOLUS (SEPSIS)
1000.0000 mL | Freq: Once | INTRAVENOUS | Status: AC
  Administered 2017-03-09: 1000 mL via INTRAVENOUS

## 2017-03-09 MED ORDER — POTASSIUM CHLORIDE 2 MEQ/ML IV SOLN
INTRAVENOUS | Status: DC
  Administered 2017-03-09: 14:00:00 via INTRAVENOUS
  Filled 2017-03-09 (×4): qty 1000

## 2017-03-09 MED ORDER — HYDROCHLOROTHIAZIDE 12.5 MG PO CAPS
12.5000 mg | ORAL_CAPSULE | Freq: Every day | ORAL | Status: DC
  Administered 2017-03-10: 12.5 mg via ORAL
  Filled 2017-03-09: qty 1

## 2017-03-09 MED ORDER — SENNOSIDES-DOCUSATE SODIUM 8.6-50 MG PO TABS
1.0000 | ORAL_TABLET | Freq: Two times a day (BID) | ORAL | Status: DC
  Administered 2017-03-09 – 2017-03-10 (×2): 1 via ORAL
  Filled 2017-03-09 (×2): qty 1
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
## 2017-03-09 NOTE — Consult Note (Signed)
[facility] Face-to-Face Psychiatry Consult   Reason for Consult:  Combative, Schizophrenia Referring Physician:  Dr. Xu Patient Identification: William [name] MRN:  [id number] Principal Diagnosis: Agitation Diagnosis:   Patient Active Problem List   Diagnosis Date Noted  . Hypernatremia [E87.0] 03/05/2017  . AKI (acute kidney injury) (HCC) [N17.9] 03/05/2017  . Schizophrenia (HCC) [F20.9] 03/05/2017  . Agitation [R45.1] 03/05/2017  . Falls [W19.XXXA] 03/05/2017  . Anemia [D64.9] 03/05/2017  . Bipolar 1 disorder (HCC) [F31.9] 03/05/2017  . BPH (benign prostatic hyperplasia) [N40.0] 03/05/2017  . Secondary parkinsonism (HCC) [G21.9] 03/05/2017    Total Time spent with patient: 45 minutes  Subjective:   William [name] is a 55 y.o. male patient admitted with combative behavior  HPI:  Patient with history of Intellectual disability, Bipolar and Schizophrenia who was brought to the hospital due to  combative behavior and altered mental status. I was asked to evaluate patient today, however, patient seems calm, pleasant and cooperative. He is a poor historian and his speech is difficult to understand but denies SI,HI, psychosis or delusions. He reports that "the banana pudding is good" and that he ate it all. Patient seems to be stable on his current medication regimen.  Past Psychiatric History:  Risk to Self: Is patient at risk for suicide?: No Risk to Others:   Prior Inpatient Therapy:   Prior Outpatient Therapy:    Past Medical History:  Past Medical History:  Diagnosis Date  . Asthma   . Bipolar affective disorder (HCC)    chronic  . BPH (benign prostatic hyperplasia)   . Chronic bronchitis (HCC)   . Crohn's colitis (HCC)   . Diabetes mellitus    Type II  . GERD (gastroesophageal reflux disease)   . Hypercholesteremia   . Hypertension   . Incontinence of feces   . Insomnia   . Mental disorder    bipolar  . Mental retardation   . Osteoporosis   . Parkinsonism, secondary  (HCC)   . Prostate hypertrophy   . PTSD (post-traumatic stress disorder)   . Schizophrenia, chronic condition (HCC)     Past Surgical History:  Procedure Laterality Date  . TONSILLECTOMY AND ADENOIDECTOMY     as child  . TOOTH EXTRACTION  10/22/2011   Procedure: DENTAL RESTORATION/EXTRACTIONS;  Surgeon: Scott M Jensen, DDS;  Location: [facility] OR;  Service: Oral Surgery;  Laterality: Bilateral;  . TRANSURETHRAL RESECTION OF PROSTATE     x 2   Family History:  Family History  Problem Relation Age of Onset  . Diabetes Father   . Heart disease Father   . Diabetes Mother   . Heart disease Mother   . Dementia Mother    Family Psychiatric  History:  Social History:  History  Alcohol Use No     History  Drug Use No    Social History   Social History  . Marital status: Single    Spouse name: N/A  . Number of children: N/A  . Years of education: N/A   Occupational History  . disabled    Social History Main Topics  . Smoking status: Never Smoker  . Smokeless tobacco: Never Used  . Alcohol use No  . Drug use: No  . Sexual activity: Not on file   Other Topics Concern  . Not on file   Social History Narrative   Single, disabled, lives in home with full time caregiver- [name]   Right handed   caffeine use - none   Additional Social   History:    Allergies:   Allergies  Allergen Reactions  . Bee Venom Anaphylaxis  . Nutritional Supplements Anaphylaxis    Care giver unsure if this is correct  . Latex Hives  . Penicillins Cross Reactors Hives and Rash  . Sulfa Drugs Cross Reactors Hives and Rash    Labs:  Results for orders placed or performed during the hospital encounter of 03/05/17 (from the past 48 hour(s))  Basic metabolic panel     Status: Abnormal   Collection Time: 03/08/17  8:16 AM  Result Value Ref Range   Sodium 143 135 - 145 mmol/L   Potassium 3.8 3.5 - 5.1 mmol/L   Chloride 113 (H) 101 - 111 mmol/L   CO2 24 22 - 32 mmol/L   Glucose, Bld 93 65 - 99  mg/dL   BUN 18 6 - 20 mg/dL   Creatinine, Ser 1.28 (H) 0.61 - 1.24 mg/dL   Calcium 7.3 (L) 8.9 - 10.3 mg/dL   GFR calc non Af Amer >60 >60 mL/min   GFR calc Af Amer >60 >60 mL/min    Comment: (NOTE) The eGFR has been calculated using the CKD EPI equation. This calculation has not been validated in all clinical situations. eGFR's persistently <60 mL/min signify possible Chronic Kidney Disease.    Anion gap 6 5 - 15  Magnesium     Status: None   Collection Time: 03/08/17  8:16 AM  Result Value Ref Range   Magnesium 2.2 1.7 - 2.4 mg/dL  Basic metabolic panel     Status: Abnormal   Collection Time: 03/09/17  6:52 AM  Result Value Ref Range   Sodium 143 135 - 145 mmol/L   Potassium 3.5 3.5 - 5.1 mmol/L   Chloride 109 101 - 111 mmol/L   CO2 26 22 - 32 mmol/L   Glucose, Bld 101 (H) 65 - 99 mg/dL   BUN 18 6 - 20 mg/dL   Creatinine, Ser 1.04 0.61 - 1.24 mg/dL   Calcium 7.6 (L) 8.9 - 10.3 mg/dL   GFR calc non Af Amer >60 >60 mL/min   GFR calc Af Amer >60 >60 mL/min    Comment: (NOTE) The eGFR has been calculated using the CKD EPI equation. This calculation has not been validated in all clinical situations. eGFR's persistently <60 mL/min signify possible Chronic Kidney Disease.    Anion gap 8 5 - 15  Magnesium     Status: None   Collection Time: 03/09/17  6:52 AM  Result Value Ref Range   Magnesium 2.4 1.7 - 2.4 mg/dL    Current Facility-Administered Medications  Medication Dose Route Frequency Provider Last Rate Last Dose  . acetaminophen (TYLENOL) tablet 650 mg  650 mg Oral Q6H PRN Carter, Nikki, MD       Or  . acetaminophen (TYLENOL) suppository 650 mg  650 mg Rectal Q6H PRN Carter, Nikki, MD      . aspirin EC tablet 81 mg  81 mg Oral Daily Carter, Nikki, MD   81 mg at 03/09/17 1000  . carbidopa-levodopa (SINEMET IR) 25-100 MG per tablet immediate release 1 tablet  1 tablet Oral TID Carter, Nikki, MD   1 tablet at 03/09/17 1000  . clonazepam (KLONOPIN) disintegrating tablet  0.25 mg  0.25 mg Oral BID Carter, Nikki, MD   0.25 mg at 03/09/17 1000  . clonazePAM (KLONOPIN) tablet 0.5 mg  0.5 mg Oral BID PRN Carter, Nikki, MD      . famotidine (PEPCID) tablet 20 mg    20 mg Oral BID Lily Kocher, MD   20 mg at 03/09/17 1000  . hydrochlorothiazide (HYDRODIURIL) tablet 25 mg  25 mg Oral BID Roney Jaffe, MD   25 mg at 03/09/17 1000  . lactated ringers infusion   Intravenous Continuous Florencia Reasons, MD 75 mL/hr at 03/08/17 1506    . magnesium hydroxide (MILK OF MAGNESIA) suspension 30 mL  30 mL Oral Daily PRN Lily Kocher, MD      . MEDLINE mouth rinse  15 mL Mouth Rinse BID Lily Kocher, MD   15 mL at 03/09/17 1052  . medroxyPROGESTERone (DEPO-PROVERA) injection 150 mg  150 mg Intramuscular Weekly Lily Kocher, MD   150 mg at 03/07/17 1148  . metoprolol tartrate (LOPRESSOR) tablet 12.5 mg  12.5 mg Oral BID Florencia Reasons, MD   12.5 mg at 03/09/17 1000  . OLANZapine zydis (ZYPREXA) disintegrating tablet 5 mg  5 mg Oral QHS Lily Kocher, MD   5 mg at 03/08/17 2339  . OLANZapine zydis (ZYPREXA) disintegrating tablet 5 mg  5 mg Oral BID PRN Lily Kocher, MD      . ondansetron [facility]) tablet 4 mg  4 mg Oral Q6H PRN Lily Kocher, MD       Or  . ondansetron [facility]) injection 4 mg  4 mg Intravenous Q6H PRN Lily Kocher, MD      . pantoprazole (PROTONIX) EC tablet 40 mg  40 mg Oral Daily Lily Kocher, MD   40 mg at 03/09/17 1000  . senna-docusate (Senokot-S) tablet 1 tablet  1 tablet Oral QHS Florencia Reasons, MD   1 tablet at 03/08/17 2339  . simvastatin (ZOCOR) tablet 10 mg  10 mg Oral QHS Lily Kocher, MD   10 mg at 03/08/17 2338  . tamsulosin (FLOMAX) capsule 0.4 mg  0.4 mg Oral Daily Lily Kocher, MD   0.4 mg at 03/09/17 1000  . traMADol (ULTRAM) tablet 50 mg  50 mg Oral TID PRN Lily Kocher, MD      . Valproate Sodium (DEPAKENE) solution 1,250 mg  1,250 mg Oral QHS Lily Kocher, MD   1,250 mg at 03/08/17 2339  . Valproate Sodium (DEPAKENE) solution 750 mg  750 mg Oral Daily  Lily Kocher, MD   750 mg at 03/09/17 1000    Musculoskeletal: Strength & Muscle Tone: not tested Gait & Station: not tested Patient leans: N/A  Psychiatric Specialty Exam: Physical Exam  Psychiatric: He has a normal mood and affect. Judgment and thought content normal. His speech is delayed. He is slowed. Cognition and memory are impaired.    Review of Systems  Constitutional: Positive for malaise/fatigue.  HENT: Negative.   Respiratory: Negative.   Cardiovascular: Negative.   Skin: Negative.   Psychiatric/Behavioral: Negative.     Blood pressure 108/65, pulse 62, temperature 98.5 F (36.9 C), temperature source Axillary, resp. rate 16, height 6' 1" (1.854 m), weight 71.7 kg (158 lb), SpO2 96 %.Body mass index is 20.85 kg/m.  General Appearance: Casual  Eye Contact:  Good  Speech:  Slow  Volume:  Decreased  Mood:  Euthymic  Affect:  Constricted  Thought Process:  Linear  Orientation:  Other:  only to place and person  Thought Content:  Negative  Suicidal Thoughts:  No  Homicidal Thoughts:  No  Memory:  Immediate;   Fair Recent;   Fair Remote;   Fair  Judgement:  Intact  Insight:  Shallow  Psychomotor Activity:  Decreased  Concentration:  Concentration: Fair and Attention Span: Fair  Recall:  Fair  Fund of Knowledge:  Fair  Language:  Fair  Akathisia:  No  Handed:  Right  AIMS (if indicated):     Assets:  Social Support  ADL's:  Impaired  Cognition:  Impaired,  Mild  Sleep:   fair     Treatment Plan Summary: Plan: -Continue current medication regimen. -Patient is cleared by psychiatric due to no imminent psychiatric risk.  Disposition: No evidence of imminent risk to self or others at present.   Patient does not meet criteria for psychiatric inpatient admission.  , , MD 03/09/2017 1:17 PM 

## 2017-03-09 NOTE — Progress Notes (Signed)
Alerted MD to BP of 94/41 at this time.

## 2017-03-09 NOTE — Progress Notes (Addendum)
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 -sodium on admission 158, serum osm 335, urine osm 259, , urine output 4.9liters /24hrs initially on presentation -he is started on D51/2ns on admission, sodium still elevated, change ivf to d5,  - nephrology consulted.he is started on hctz, sodium now normalized, he is eating well, will continue ivf with LR with k for another 24hrs, if urine output continue to be stable, will stop ivf  I have discussed with nephrology, patient likely will benefit long term hctz, dose can be adjusted from once a day to twice a day according to urine output daily. Will decrease hctz to 12.5mg  daily, monitor effect.  ARF: -Likely prerenal -Cr 1.39 on admission, cr improved to 1.09 after hydration, then increased again, likely due to significant amount of urine output,  -Renal US suboptimal due to patient not able to cooperate with the exam -continue ivf with LR , repeat bmp in am, consider d/c hctz if sodium continue to be stable in am.  Macrocytic Anemia:  -B12/folate/tsh/hiv wnl, retic inappropriately  low, normal ferritin, iron panel suggest anemia of chronic disease -No acute bleed, no indication for transfusion  Schizophrenia, Bipolar, Mood disturbance --Continue home doses of klonopin, zyprexa, depakote (valproic acid elvel 67) --AVOID ativan as a prn; paradoxical response reported --Sitter  --psych consulted  Recent scalp laceration --Stitches removed on Friday  BPH --Flomax  Secondary parkinson's --Sinemet  FTT; family report everytime he has a exacerbation of his psychiatric condition, he stopped walking, family reports he has not walked for a week, start PT now that sodium improves.   Paroxysmal afib, with tendency to have bradycardia in the 50's   -bp stable, no prior diagnosis of aifb,  -start low dose lopressor with holding parameters,  -chads2vasc score of 0-1, there is documented  h/o diabetes though he is not on any meds for this and no hyperglycemia in the hospital.  -He is not a candidate for anticoagulation anyway due to h/o frequent falls, continue asa 81mg ,   -echocardiogram "Mild LVH with LVEF 55-60% and grossly normal diastolic function.   Mildly calcified mitral annulus with trivial mitral   regurgitation. Mildly dilated aortic root. Sclerotic aortic valve   with mild aortic regurgitation. Mild tricuspid regurgitation with   PASP estimated 22 mmHg. -tsh wnl. Will keep k>4, mag > 2,   Code Status: full  Family Communication: patient , sister over the phone  Disposition Plan: return to group home? On 7/2 awaiting psych and social worker input  Return to group home vs psych facility placement after medical cleared.  Consultants:  Nephrology  psychiatry  Procedures:  none  Antibiotics:  none   Objective: BP 108/65 (BP Location: Left Arm)   Pulse 62   Temp 98.5 F (36.9 C) (Axillary)   Resp 16   Ht 6\' 1"  (1.854 m)   Wt 73 kg (161 lb)   SpO2 96%   BMI 21.24 kg/m   Intake/Output Summary (Last 24 hours) at 03/09/17 0805 Last data  filed at 03/09/17 7829  Gross per 24 hour  Intake             2930 ml  Output             2100 ml  Net              830 ml   Filed Weights   03/05/17 2255 03/08/17 1300 03/09/17 0550  Weight: 68.5 kg (151 lb) 70 kg (154 lb 5.2 oz) 73 kg (161 lb)    Exam:   General:  Continue to improve, he started to make eye contact, attempt to follow commands, no agitation, he eats well  Cardiovascular: RRR  Respiratory: CTABL  Abdomen: Soft/ND/NT, positive BS  Musculoskeletal: No Edema  Neuro: calm and cooperative, attempt to follow commands,moving all extremities spontaneously   Data Reviewed: Basic Metabolic Panel:  Recent Labs Lab 03/06/17 0808 03/06/17 1445 03/07/17 0336 03/08/17 0816 03/09/17 0652  NA 157* 152* 148* 143 143  K 3.8 3.8 4.5 3.8 3.5  CL 126* 126* 118* 113* 109  CO2 26 24 26 24 26   GLUCOSE 89 112* 107* 93 101*  BUN 19 17 15 18 18   CREATININE 1.09 1.16 1.29* 1.28* 1.04  CALCIUM 8.0* 7.2* 7.6* 7.3* 7.6*  MG  --   --   --  2.2 2.4   Liver Function Tests:  Recent Labs Lab 03/05/17 1814  AST 35  ALT 39  ALKPHOS 79  BILITOT 0.6  PROT 5.8*  ALBUMIN 2.9*   No results for input(s): LIPASE, AMYLASE in the last 168 hours.  Recent Labs Lab 03/05/17 1815  AMMONIA 20   CBC:  Recent Labs Lab 03/05/17 1814 03/05/17 2320 03/06/17 0331  WBC 7.9  --  6.3  NEUTROABS 5.2  --   --   HGB 8.0* 8.2* 8.0*  HCT 25.8* 26.8* 25.7*  MCV 109.8*  --  108.4*  PLT 141*  --  143*   Cardiac Enzymes:   No results for input(s): CKTOTAL, CKMB, CKMBINDEX, TROPONINI in the last 168 hours. BNP (last 3 results) No results for input(s): BNP in the last 8760 hours.  ProBNP (last 3 results) No results for input(s): PROBNP in the last 8760 hours.  CBG: No results for input(s): GLUCAP in the last 168 hours.  Recent Results (from the past 240 hour(s))  MRSA PCR Screening     Status: None   Collection Time: 03/05/17 10:30 PM  Result Value Ref Range Status   MRSA by PCR  NEGATIVE NEGATIVE Final    Comment:        The GeneXpert MRSA Assay (FDA approved for NASAL specimens only), is one component of a comprehensive MRSA colonization surveillance program. It is not intended to diagnose MRSA infection nor to guide or monitor treatment for MRSA infections.      Studies: No results found.  Scheduled Meds: . aspirin EC  81 mg Oral Daily  . carbidopa-levodopa  1 tablet Oral TID  . clonazepam  0.25 mg Oral BID  . famotidine  20 mg Oral BID  . hydrochlorothiazide  25 mg Oral BID  . mouth  rinse  15 mL Mouth Rinse BID  . medroxyPROGESTERone  150 mg Intramuscular Weekly  . metoprolol tartrate  12.5 mg Oral BID  . OLANZapine zydis  5 mg Oral QHS  . pantoprazole  40 mg Oral Daily  . senna-docusate  1 tablet Oral QHS  . simvastatin  10 mg Oral QHS  . tamsulosin  0.4 mg Oral Daily  . Valproate Sodium  1,250 mg Oral QHS  . Valproate Sodium  750 mg Oral Daily    Continuous Infusions: . lactated ringers 75 mL/hr at 03/08/17 1506     Time spent: 25mins  Erastus Bartolomei MD, PhD  [facility] Pager [phone]. If 7PM-7AM, please contact night-coverage at [email or web address], password [email or web address] 03/09/2017, 8:05 AM  LOS: 3 days

## 2017-03-10 ENCOUNTER — Inpatient Hospital Stay (HOSPITAL_COMMUNITY): Payer: Medicare Other

## 2017-03-10 DIAGNOSIS — M7989 Other specified soft tissue disorders: Secondary | ICD-10-CM

## 2017-03-10 LAB — CBC WITH DIFFERENTIAL/PLATELET
BASOS ABS: 0 10*3/uL (ref 0.0–0.1)
BASOS PCT: 0 %
EOS PCT: 3 %
Eosinophils Absolute: 0.2 10*3/uL (ref 0.0–0.7)
HCT: 27.8 % — ABNORMAL LOW (ref 39.0–52.0)
Hemoglobin: 8.9 g/dL — ABNORMAL LOW (ref 13.0–17.0)
Lymphocytes Relative: 24 %
Lymphs Abs: 1.9 10*3/uL (ref 0.7–4.0)
MCH: 34.2 pg — ABNORMAL HIGH (ref 26.0–34.0)
MCHC: 32 g/dL (ref 30.0–36.0)
MCV: 106.9 fL — ABNORMAL HIGH (ref 78.0–100.0)
MONO ABS: 1.3 10*3/uL — AB (ref 0.1–1.0)
Monocytes Relative: 16 %
Neutro Abs: 4.7 10*3/uL (ref 1.7–7.7)
Neutrophils Relative %: 57 %
PLATELETS: 169 10*3/uL (ref 150–400)
RBC: 2.6 MIL/uL — ABNORMAL LOW (ref 4.22–5.81)
RDW: 15.9 % — AB (ref 11.5–15.5)
WBC: 8.1 10*3/uL (ref 4.0–10.5)

## 2017-03-10 LAB — BASIC METABOLIC PANEL
ANION GAP: 7 (ref 5–15)
BUN: 16 mg/dL (ref 6–20)
CALCIUM: 7.9 mg/dL — AB (ref 8.9–10.3)
CO2: 26 mmol/L (ref 22–32)
Chloride: 109 mmol/L (ref 101–111)
Creatinine, Ser: 0.94 mg/dL (ref 0.61–1.24)
Glucose, Bld: 96 mg/dL (ref 65–99)
Potassium: 3.5 mmol/L (ref 3.5–5.1)
SODIUM: 142 mmol/L (ref 135–145)

## 2017-03-10 LAB — HEMOGLOBIN A1C
Hgb A1c MFr Bld: 4.5 % — ABNORMAL LOW (ref 4.8–5.6)
MEAN PLASMA GLUCOSE: 82 mg/dL

## 2017-03-10 LAB — MAGNESIUM: MAGNESIUM: 2.2 mg/dL (ref 1.7–2.4)

## 2017-03-10 MED ORDER — POTASSIUM CHLORIDE ER 10 MEQ PO TBCR
10.0000 meq | EXTENDED_RELEASE_TABLET | Freq: Every day | ORAL | 0 refills | Status: DC
Start: 2017-03-10 — End: 2020-01-31

## 2017-03-10 MED ORDER — POTASSIUM CHLORIDE CRYS ER 20 MEQ PO TBCR: 40.0000 meq | EXTENDED_RELEASE_TABLET | Freq: Once | ORAL | Status: DC

## 2017-03-10 MED ORDER — SENNOSIDES-DOCUSATE SODIUM 8.6-50 MG PO TABS: 1.0000 | ORAL_TABLET | Freq: Every day | ORAL | 0 refills | Status: AC

## 2017-03-10 MED ORDER — POLYETHYLENE GLYCOL 3350 17 G PO PACK: 17.0000 g | PACK | Freq: Every day | ORAL | 0 refills | Status: AC

## 2017-03-10 MED ORDER — HYDROCHLOROTHIAZIDE 12.5 MG PO CAPS: 12.5000 mg | ORAL_CAPSULE | Freq: Two times a day (BID) | ORAL | 0 refills | Status: DC

## 2017-03-10 MED ORDER — CLONAZEPAM 0.5 MG PO TABS: 0.5000 mg | ORAL_TABLET | Freq: Four times a day (QID) | ORAL | 0 refills | Status: AC | PRN

## 2017-03-10 NOTE — Progress Notes (Signed)
Pt discharged from the unit via wheelchair. Discharge instructions were reviewed with the caregiver. Ultrasound of RUE was performed before d/c. No questions or concerns at this time.  Donnajean Chesnut W Ladarious Kresse, RN

## 2017-03-10 NOTE — Care Management (Signed)
.      Durable Medical Equipment        Start     Ordered   03/10/17 1341  For home use only DME Hospital bed  Once    Comments:  Will need ability to lower bed close to floor due to falls, and cognitive impairment  Question Answer Comment  Patient has (list medical condition): Secondary Parkinsonism, cognitive impairment, schizophrenia, Bipolar disorder, HTN, BPH, secondary Parkinsonism   The above medical condition requires: Patient requires the ability to reposition frequently   Head must be elevated greater than: Other see comments   Bed type Semi-electric   Trapeze Bar Yes      03/10/17 1347   03/10/17 1240  For home use only DME Walker rolling  Once    Question:  Patient needs a walker to treat with the following condition  Answer:  Parkinson disease (HCC)   03/10/17 1240

## 2017-03-10 NOTE — Evaluation (Signed)
Physical Therapy Evaluation Patient Details Name: William [name] MRN: [id number] DOB: 02/13/1961 Today's Date: 03/10/2017   History of Present Illness  56 yo male admitted with agitation, falls, hypernatremia, A fib. Hx of schizophrenia, bipolar d/o, Parkinsons, combativeness/agitation within last few weeks, PTSD    Clinical Impression  On eval, pt required Total assist for mobility. Sat pt at EOB for 1-2 minutes with total assist provided for static sitting balance. Pt did participate with getting LEs back onto bed with cueing. Poor eye contact from pt noted during session. He did not follow 1 step commands well/consistently. No family or caregivers present during session. Recommend SNF unless foster home can manage pt at current level    Follow Up Recommendations SNF (unless foster home can manage pt at current level)    Equipment Recommendations   (continuing to assess. unsure of DME available to pt at foster home)    Recommendations for Other Services       Precautions / Restrictions Precautions Precautions: Fall Restrictions Weight Bearing Restrictions: No      Mobility  Bed Mobility Overal bed mobility: Needs Assistance Bed Mobility: Supine to Sit;Sit to Supine     Supine to sit: Total assist;HOB elevated Sit to supine: Max assist;HOB elevated   General bed mobility comments: Assist for trunk and bil LEs. Pt did participate with getting LEs back onto bed. Sat EOB ~1-2 minutes with Total assist.   Transfers                 General transfer comment: NT-pt unable currently  Ambulation/Gait                Stairs            Wheelchair Mobility    Modified Rankin (Stroke Patients Only)       Balance                                             Pertinent Vitals/Pain Pain Assessment: Faces Faces Pain Scale: No hurt    Home Living [facility] be [facility]                       Prior Function Level of Independence: Needs assistance         Comments: per chart, working with HHPT/HHOT     Hand Dominance        Extremity/Trunk Assessment   Upper Extremity Assessment Upper Extremity Assessment: Difficult to assess due to impaired cognition    Lower Extremity Assessment Lower Extremity Assessment: Difficult to assess due to impaired cognition       Communication   Communication: Expressive difficulties;Receptive difficulties  Cognition Arousal/Alertness: Awake/alert Behavior During Therapy: Flat affect Overall Cognitive Status: History of cognitive impairments - at baseline                                        General Comments      Exercises General Exercises - Lower Extremity Heel Slides: PROM;Both;5 reps;Supine   Assessment/Plan    PT Assessment Patient needs continued PT services  PT Problem List Decreased strength;Decreased mobility;Decreased activity tolerance;Decreased cognition;Decreased balance;Decreased knowledge of use of DME       PT Treatment Interventions Therapeutic activities;Gait training;Therapeutic exercise;Patient/family  education;DME instruction;Functional mobility training;Balance training    PT Goals (Current goals can be found in the Care Plan section)  Acute Rehab PT Goals Patient Stated Goal: pt unable. no family or caregive present PT Goal Formulation: Patient unable to participate in goal setting Time For Goal Achievement: 03/24/17 Potential to Achieve Goals: Fair    Frequency Min 2X/week   Barriers to discharge        Co-evaluation               AM-PAC PT "6 Clicks" Daily Activity  Outcome Measure Difficulty turning over in bed (including adjusting bedclothes, sheets and blankets)?: Total Difficulty moving from lying on back to sitting on the side of the bed? : Total Difficulty sitting down on and standing up from a chair with arms (e.g., wheelchair, bedside commode, etc,.)?:  Total Help needed moving to and from a bed to chair (including a wheelchair)?: Total Help needed walking in hospital room?: Total Help needed climbing 3-5 steps with a railing? : Total 6 Click Score: 6    End of Session   Activity Tolerance: Patient tolerated treatment well Patient left: in bed;with call bell/phone within reach;with bed alarm set   PT Visit Diagnosis: Muscle weakness (generalized) (M62.81);Difficulty in walking, not elsewhere classified (R26.2)    Time: 1093-2355 PT Time Calculation (min) (ACUTE ONLY): 13 min   Charges:   PT Evaluation $PT Eval Moderate Complexity: 1 Procedure     PT G Codes:          Rebeca Alert, MPT Pager: [phone]

## 2017-03-10 NOTE — Discharge Summary (Signed)
Discharge Summary  William [name] ZOX:[id number] DOB: May 03, 1961  PCP: Abigail Miyamoto, MD  Admit date: 03/05/2017 Discharge date: 03/10/2017  Time spent: >38mins, more than 50% time spent on coordination of care.  Recommendations for Outpatient Follow-up:  1. F/u with PMD within a week  for hospital discharge follow up, repeat cbc/bmp at follow up 2. Return to group home with home health ( patient's sister lives with him)  Discharge Diagnoses:  Active Hospital Problems   Diagnosis Date Noted  . Agitation 03/05/2017  . Hypernatremia 03/05/2017  . AKI (acute kidney injury) (HCC) 03/05/2017  . Schizophrenia (HCC) 03/05/2017  . Falls 03/05/2017  . Anemia 03/05/2017  . Bipolar 1 disorder (HCC) 03/05/2017  . BPH (benign prostatic hyperplasia) 03/05/2017  . 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    Discharge Condition: stable  Diet recommendation: heart healthy  Filed Weights   03/09/17 0550 03/09/17 1201 03/10/17 0553  Weight: 73 kg (161 lb) 71.7 kg (158 lb) 71.2 kg (157 lb)    History of present illness:  PCP: Abigail Miyamoto, MD   Patient coming from: Home  Chief Complaint: Told to report to the ED for evaluation of AKI  HPI: William [name] is a 56 y.o. gentleman with a history of cognitive impairment, schizophrenia, Bipolar disorder, HTN, BPH, secondary Parkinsonism, and self-destructive behaviors who was brought to the ED tonight by caregivers for follow-up of abnormal labs.  Caregivers have documented several weeks to months of combative, aggressive, and self-destructive behaviors including hitting, kicking, and banging his head against the wall.  He also intermittently refuses to eat and has been caught "pocketing" his medications in his mouth.  He has also had recurrent falls.  He is under the care of a psychiatrist as an outpatient.  He was sent for  labs at the beginning of June which showed a sodium level of 148, creatinine 1.28, Hgb 10.  Labs were repeated on Friday and reportedly showed creatinine of 1.5 and sodium greater than 150.  Lithium taper was recommended and the patient was also advised to reported to the ED for evaluation of hypernatremia and AKI (baseline creatinine normal in this system two years ago).  Per his caregiver, he has been eating and drinking well when he is compliant.  No known LOC or seizure activity.  He has had recent falls that have led to extensive bruising to the lateral aspect of his left leg.  He has a laceration to the right side of his posterior scalp (has stitches that are due to come out on Friday).  No gross bleeding.  No chest pain or shortness of breath.  Of note, he was diagnosed with relative hypotension on May 31st, and his Toprol XL (and Ambien) was discontinued on this day.  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  ED Course: Hgb 8 (down from 10 earlier this month).  Platelet count 141.  Sodium 158.  BUN 30.  Creatinine 1.39.  Lithium level WNL.  Valproic acid level WNL.  Head CT negative for acute process.  Chest xray negative for acute process.  Hip/pelvis xray negative for acute process.  Hospitalist asked to admit.   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 -sodium on admission 158, serum osm 335, urine osm 259, ,  urine output 4.9liters /24hrs initially on presentation -he is started on D51/2ns on admission, sodium still elevated, changed ivf to d5,  - nephrology consulted.he is started on hctz 25mg  bid, sodium now normalized, he is off d5, he is eating well,  -sodium has been stable for the last three day, patient clinically is improving, on day of discharge he actually has clear speech while before his words were indistinct.   Per nephrology patient  will benefit long term hctz, dose can be adjusted according to urine output daily. He is discharged on hctz12.5mg  bid.  ARF: -Likely prerenal -Cr 1.39 on admission, cr improved to 1.09 after hydration, then increased again, likely due to significant amount of urine output,  -Renal US suboptimal due to patient not able to cooperate with the exam -cr normalized and has been stable for the last two days  Macrocytic Anemia:  -B12/folate/tsh/hiv wnl, retic inappropriately low, normal ferritin, iron panel suggest anemia of chronic disease -No acute bleed, no indication for transfusion -pmd to continue to monitor  Schizophrenia, Bipolar, Mood disturbance --Continue home doses of klonopin, zyprexa, depakote (valproic acid elvel 67) --AVOID ativan as a prn; paradoxical response reported ----psych consulted -patient has improved, no agitation after sodium normalized  Recent scalp laceration --Stitches removed on Friday  BPH --Flomax  Secondary parkinson's --Sinemet  FTT; family report everytime he has a exacerbation of his psychiatric condition, he stops walking, family reports he has not walked for a week prior to coming to the hospital. Sister who is the care giver who lives with the patient feel comfortable get patient back to group home [facility].   Paroxysmal afibx1  Episode initial on presentation, with tendency to have bradycardia in the 50's   -bp stable, no prior diagnosis of aifb,  --chads2vasc score of 0-1, there  is documented  h/o diabetes though he is not on any meds for this and no hyperglycemia in the hospital.  -He is not a candidate for anticoagulation anyway due to h/o frequent falls, continue asa 81mg ,   -echocardiogram "Mild LVH with LVEF 55-60% and grossly normal diastolic function. Mildly calcified mitral annulus with trivial mitral regurgitation. Mildly dilated aortic root. Sclerotic aortic valve with mild aortic regurgitation. Mild tricuspid regurgitation with PASP estimated 22 mmHg. -tsh wnl. Will keep k>4, mag > 2,  -outpatient cardiology follow up. Consider outpatient cardiac monitor.  Code Status: full  Family Communication: patient , sister in room  Disposition Plan: return to group home to sister with home health   Consultants:  Nephrology  psychiatry  Procedures:  none  Antibiotics:  none    Discharge Exam: BP 107/62 (BP Location: Right Arm)   Pulse 62   Temp 98.5 F (36.9 C) (Oral)   Resp 17   Ht 6\' 1"  (1.854 m)   Wt 71.2 kg (157 lb)   SpO2 100%   BMI 20.71 kg/m   General: speech is much clear, oriented to self, cooperative Cardiovascular: bradycardia Respiratory: CTABL  Discharge Instructions You were cared for by a hospitalist during your hospital  stay. If you have any questions about your discharge medications or the care you received while you were in the hospital after you are discharged, you can call the unit and asked to speak with the hospitalist on call if the hospitalist that took care of you is not available. Once you are discharged, your primary care physician will handle any further medical issues. Please note that NO REFILLS for any discharge medications will be authorized once you are discharged, as it is imperative that you return to your primary care physician (or establish a relationship with a primary care physician if you do not have one) for your aftercare needs so that they can reassess your need for medications and  monitor your lab values.  Discharge Instructions    Diet general    Complete by:  As directed    Face-to-face encounter (required for Medicare/Medicaid patients)    Complete by:  As directed    I Jakya Dovidio certify that this patient is under my care and that I, or a nurse practitioner or physician's assistant working with me, had a face-to-face encounter that meets the physician face-to-face encounter requirements with this patient on 03/10/2017. The encounter with the patient was in whole, or in part for the following medical condition(s) which is the primary reason for home health care (List medical condition): FTT   The encounter with the patient was in whole, or in part, for the following medical condition, which is the primary reason for home health care:  FTT   I certify that, based on my findings, the following services are medically necessary home health services:   Nursing Physical therapy     Reason for Medically Necessary Home Health Services:  Skilled Nursing- Change/Decline in Patient Status   My clinical findings support the need for the above services:  Cognitive impairments, dementia, or mental confusion  that make it unsafe to leave home   Further, I certify that my clinical findings support that this patient is homebound due to:  Mental confusion   Home Health    Complete by:  As directed    To provide the following care/treatments:   PT OT SLP RN     RN to draw bmp in three days, RN to check FOBTx1   Increase activity slowly    Complete by:  As directed    Walker rolling    Complete by:  As directed      Allergies as of 03/10/2017      Reactions   Bee Venom Anaphylaxis   Nutritional Supplements Anaphylaxis   Care giver unsure if this is correct   Latex Hives   Penicillins Cross Reactors Hives, Rash   Sulfa Drugs Cross Reactors Hives, Rash      Medication List    TAKE these medications   acetaminophen 325 MG tablet Commonly known as:  TYLENOL Take 650 mg by  mouth every 6 (six) hours as needed for moderate pain. What changed:  Another medication with the same name was removed. Continue taking this medication, and follow the directions you see here.   alendronate 70 MG tablet Commonly known as:  FOSAMAX Take 70 mg by mouth once a week.   aspirin EC 81 MG tablet Take 81 mg by mouth daily.   azelastine 0.1 % nasal spray Commonly known as:  ASTELIN Place 1 spray into both nostrils 2 (two) times daily as needed for rhinitis or allergies. Use in each nostril as directed   BENEFIBER PO Take 15  mLs by mouth daily as needed (constipation).   BISMATROL 262 MG/15ML suspension Generic drug:  bismuth subsalicylate Take 30 mLs by mouth every 6 (six) hours as needed.   Calamine 8-8 % Lotn Apply 1 application topically as needed (rash).   Calcium-Vitamin D 600-400 MG-UNIT Tabs Take 2 tablets by mouth daily.   carbidopa-levodopa 25-100 MG tablet Commonly known as:  SINEMET IR Take 1 tablet by mouth 3 (three) times daily.   CEPACOL SORE THROAT 15-3.6 MG Lozg Generic drug:  Benzocaine-Menthol Use as directed 1 lozenge in the mouth or throat every 2 (two) hours as needed (sore throat).   clonazePAM 0.5 MG tablet Commonly known as:  KLONOPIN Take 1 tablet (0.5 mg total) by mouth 4 (four) times daily as needed for anxiety (agitation, sleep). What changed:  when to take this  reasons to take this  Another medication with the same name was removed. Continue taking this medication, and follow the directions you see here.   COPPERTONE KIDS SPF15 EX Apply 1 application topically daily as needed (prevent sunburn).   diphenhydrAMINE spray Commonly known as:  BENADRYL Apply 1 application topically every 6 (six) hours as needed for itching.   ENSURE PLUS Liqd Take 237 mLs by mouth 2 (two) times daily between meals.   EPINEPHrine 0.3 mg/0.3 mL Devi Commonly known as:  EPI-PEN Inject 0.3 mg into the muscle once. For allergic reaction to bees     fexofenadine 180 MG tablet Commonly known as:  ALLEGRA Take 180 mg by mouth daily as needed for allergies or rhinitis.   hydrochlorothiazide 12.5 MG capsule Commonly known as:  MICROZIDE Take 1 capsule (12.5 mg total) by mouth 2 (two) times daily.   hydrocortisone cream 1 % Apply 1 application topically 4 (four) times daily as needed for itching.   hydrocortisone 1 % lotion Apply 1 application topically 4 (four) times daily as needed for itching.   hydrocortisone 1 % ointment Apply 1 application topically 4 (four) times daily as needed for itching.   ibuprofen 200 MG tablet Commonly known as:  ADVIL,MOTRIN Take 400 mg by mouth every 8 (eight) hours as needed for moderate pain.   magnesium hydroxide 400 MG/5ML suspension Commonly known as:  MILK OF MAGNESIA Take 30 mLs by mouth daily as needed for mild constipation.   medroxyPROGESTERone 150 MG/ML injection Commonly known as:  DEPO-PROVERA Inject 150 mg into the muscle once a week. On fridays   neomycin-bacitracin-polymyxin [phone] ointment Apply 1 application topically 3 (three) times daily as needed (minor cuts).   NEOSPORIN AF EX Apply 1 application topically 3 (three) times daily as needed (minor cuts).   OASIS MOISTURIZING MOUTH SPRAY 35 % Liqd Generic drug:  Glycerin Use as directed 2 sprays in the mouth or throat daily as needed (dry mouth).   OLANZapine zydis 5 MG disintegrating tablet Commonly known as:  ZYPREXA Take 5 mg by mouth at bedtime.   OLANZapine zydis 5 MG disintegrating tablet Commonly known as:  ZYPREXA Take 5 mg by mouth 2 (two) times daily as needed (agitation).   omeprazole 20 MG capsule Commonly known as:  PRILOSEC Take 20 mg by mouth 2 (two) times daily.   phenol 1.4 % Liqd Commonly known as:  CHLORASEPTIC Use as directed 1 spray in the mouth or throat every 2 (two) hours as needed for throat irritation / pain.   polyethylene glycol packet Commonly known as:  MIRALAX /  GLYCOLAX Take 17 g by mouth daily. Start taking on:  03/11/2017  potassium chloride 10 MEQ tablet Commonly known as:  K-DUR Take 1 tablet (10 mEq total) by mouth daily.   povidone-iodine 10 % external solution Commonly known as:  BETADINE Apply 1 application topically as needed for wound care.   pseudoephedrine 30 MG tablet Commonly known as:  SUDAFED Take 30-60 mg by mouth every 4 (four) hours as needed for congestion.   ranitidine 150 MG tablet Commonly known as:  ZANTAC Take 150 mg by mouth daily.   ROBAFEN 100 MG/5ML syrup Generic drug:  guaifenesin Take 200 mg by mouth 3 (three) times daily as needed for cough.   SELSUN BLUE EX Apply 1 application topically daily. Shampoo once a day   senna-docusate 8.6-50 MG tablet Commonly known as:  Senokot-S Take 1 tablet by mouth at bedtime.   SENSODYNE DT Place onto teeth. Brush teeth twice a day.   simvastatin 10 MG tablet Commonly known as:  ZOCOR Take 10 mg by mouth at bedtime.   tamsulosin 0.4 MG Caps capsule Commonly known as:  FLOMAX Take 0.4 mg by mouth daily.   tolnaftate 1 % powder Commonly known as:  TINACTIN Apply 1 application topically daily as needed for itching.   tolnaftate 1 % spray Commonly known as:  TINACTIN Apply 1 spray topically daily as needed (itching).   traMADol 50 MG tablet Commonly known as:  ULTRAM Take 50 mg by mouth 3 (three) times daily as needed for moderate pain or severe pain.   URISTAT 95 MG tablet Generic drug:  phenazopyridine Take 95 mg by mouth 3 (three) times daily as needed for pain.   Valproic Acid 250 MG/5ML Syrp syrup Commonly known as:  DEPAKENE Take 1,250 mg by mouth at bedtime.   Valproic Acid 250 MG/5ML Syrp syrup Commonly known as:  DEPAKENE Take 750 mg by mouth every morning.      Allergies  Allergen Reactions  . Bee Venom Anaphylaxis  . Nutritional Supplements Anaphylaxis    Care giver unsure if this is correct  . Latex Hives  . Penicillins Cross  Reactors Hives and Rash  . Sulfa Drugs Cross Reactors Hives and Rash   Follow-up Information    Abigail Miyamoto, MD Follow up on 03/18/2017.   Specialty:  Family Medicine Why:  hospital discharge follow up, repeat cbc/bmp at follow up. pmd to refer patient to cardiology for bradycardia and paroxysmal afib. Contact information: [address] [place] [address]            The results of significant diagnostics from this hospitalization (including imaging, microbiology, ancillary and laboratory) are listed below for reference.    Significant Diagnostic Studies: Dg Chest 2 View  Result Date: 03/05/2017 CLINICAL DATA:  Altered mental status. Multiple falls. Aggressive behavior. EXAM: CHEST  2 VIEW COMPARISON:  Chest radiograph 08/08/2015. FINDINGS: The heart size and mediastinal contours are within normal limits. Both lungs are clear. Scattered granulomata. The visualized skeletal structures are unremarkable. IMPRESSION: No active cardiopulmonary disease.  Stable exam. Electronically Signed   By: Elsie Stain M.D.   On: 03/05/2017 18:20   Ct Head Wo Contrast  Result Date: 03/05/2017 CLINICAL DATA:  Multiple falls. EXAM: CT HEAD WITHOUT CONTRAST TECHNIQUE: Contiguous axial images were obtained from the base of the skull through the vertex without intravenous contrast. COMPARISON:  08/08/2015 FINDINGS: Brain: Similar findings of atrophy with sulcal prominence centralized volume loss. Scattered periventricular hypodensities compatible with microvascular ischemic disease. Given background parenchymal abnormalities, there is no CT evidence superimposed acute large territory  infarct. No intraparenchymal or extra-axial mass or hemorrhage. Normal size and configuration of the ventricles and the basilar cisterns. No midline shift. Vascular: Intracranial atherosclerosis. Skull: No displaced calvarial fracture. Sinuses/Orbits: Limited visualization the paranasal sinuses and mastoid air cells is  normal. No air-fluid levels. Other: Regional soft tissues appear normal. IMPRESSION: Similar findings of atrophy and microvascular ischemic disease without acute intracranial process. Electronically Signed   By: Simonne Come M.D.   On: 03/05/2017 17:43   US Renal  Result Date: 03/06/2017 CLINICAL DATA:  56 y/o  M; acute kidney injury. EXAM: RENAL / URINARY TRACT ULTRASOUND COMPLETE COMPARISON:  None. FINDINGS: Right Kidney: Length: 10.8 cm. Echogenicity within normal limits. No mass or hydronephrosis visualized. Left Kidney: Patient would not cooperate with examination, no acoustic window available. Bladder: Appears normal for degree of bladder distention. Bilateral renal jets noted. IMPRESSION: 1. Normal right kidney. 2. Left kidney could not be assessed.  See above. Electronically Signed   By: Mitzi Hansen M.D.   On: 03/06/2017 00:37   Dg Hip Unilat W Or Wo Pelvis 2-3 Views Left  Result Date: 03/05/2017 CLINICAL DATA:  Multiple falls recently. EXAM: DG HIP (WITH OR WITHOUT PELVIS) 2-3V LEFT COMPARISON:  None. FINDINGS: Examination is minimally degraded due to obliquity. No definite displaced hip or pelvic fracture. Left hip joint spaces appear preserved. No evidence avascular necrosis. Limited visualization the contralateral right hip is normal. Punctate phleboliths overlie the lower pelvis bilaterally, left greater than right. Moderate colonic stool burden without evidence of enteric obstruction. IMPRESSION: No definite displaced hip or pelvic fracture. Electronically Signed   By: Simonne Come M.D.   On: 03/05/2017 18:18    Microbiology: Recent Results (from the past 240 hour(s))  MRSA PCR Screening     Status: None   Collection Time: 03/05/17 10:30 PM  Result Value Ref Range Status   MRSA by PCR NEGATIVE NEGATIVE Final    Comment:        The GeneXpert MRSA Assay (FDA approved for NASAL specimens only), is one component of a comprehensive MRSA colonization surveillance program. It  is not intended to diagnose MRSA infection nor to guide or monitor treatment for MRSA infections.      Labs: Basic Metabolic Panel:  Recent Labs Lab 03/06/17 1445 03/07/17 0336 03/08/17 0816 03/09/17 0652 03/10/17 0526  NA 152* 148* 143 143 142  K 3.8 4.5 3.8 3.5 3.5  CL 126* 118* 113* 109 109  CO2 24 26 24 26 26   GLUCOSE 112* 107* 93 101* 96  BUN 17 15 18 18 16   CREATININE 1.16 1.29* 1.28* 1.04 0.94  CALCIUM 7.2* 7.6* 7.3* 7.6* 7.9*  MG  --   --  2.2 2.4 2.2   Liver Function Tests:  Recent Labs Lab 03/05/17 1814  AST 35  ALT 39  ALKPHOS 79  BILITOT 0.6  PROT 5.8*  ALBUMIN 2.9*   No results for input(s): LIPASE, AMYLASE in the last 168 hours.  Recent Labs Lab 03/05/17 1815  AMMONIA 20   CBC:  Recent Labs Lab 03/05/17 1814 03/05/17 2320 03/06/17 0331 03/10/17 0526  WBC 7.9  --  6.3 8.1  NEUTROABS 5.2  --   --  4.7  HGB 8.0* 8.2* 8.0* 8.9*  HCT 25.8* 26.8* 25.7* 27.8*  MCV 109.8*  --  108.4* 106.9*  PLT 141*  --  143* 169   Cardiac Enzymes: No results for input(s): CKTOTAL, CKMB, CKMBINDEX, TROPONINI in the last 168 hours. BNP: BNP (last 3 results) No  results for input(s): BNP in the last 8760 hours.  ProBNP (last 3 results) No results for input(s): PROBNP in the last 8760 hours.  CBG: No results for input(s): GLUCAP in the last 168 hours.     SignedAlbertine Grates:  Tayjon Halladay MD, PhD  [facility] 03/10/2017, 12:38 PM

## 2017-03-10 NOTE — Progress Notes (Signed)
**  Preliminary report by tech**  Right upper extremity venous duplex complete. There is no obvious evidence of deep or superficial vein thrombosis involving the right upper extremity. All clearly visualized vessels appear patent and compressible.   03/10/17 1:40 PM Olen CordialGreg Lorrane Mccay RVT

## 2017-03-10 NOTE — [place] FL2 (Signed)
[place] MEDICAID FL2 LEVEL OF CARE SCREENING TOOL     IDENTIFICATION  Patient Name: William [name] Birthdate: 10/13/1960 Sex: male Admission Date (Current Location): 03/05/2017  [place]:  [place] [id number] T Facility and Address:  [facility],  [address], [place] [id number]      Provider Number: [id number]  Attending Physician Name and Address:  Albertine GratesXu, Shallon Yaklin, MD  Relative Name and Phone Number:       Current Level of Care: Hospital Recommended Level of Care: Other (Comment) (Group Home) Prior Approval Number:    Date Approved/Denied:   PASRR Number:    Discharge Plan:  (Group Home)    Current Diagnoses: Patient Active Problem List   Diagnosis Date Noted  . Hypernatremia 03/05/2017  . AKI (acute kidney injury) (HCC) 03/05/2017  . Schizophrenia (HCC) 03/05/2017  . Agitation 03/05/2017  . Falls 03/05/2017  . Anemia 03/05/2017  . Bipolar 1 disorder (HCC) 03/05/2017  . BPH (benign prostatic hyperplasia) 03/05/2017  . Secondary parkinsonism (HCC) 03/05/2017    Orientation RESPIRATION BLADDER Height & Weight     Self (Responds to voice )  Normal Incontinent Weight: 157 lb (71.2 kg) Height:  6\' 1"  (185.4 cm)  BEHAVIORAL SYMPTOMS/MOOD NEUROLOGICAL BOWEL NUTRITION STATUS      Incontinent Diet (See Discharge Summary )  AMBULATORY STATUS COMMUNICATION OF NEEDS Skin   Extensive Assist Verbally Other (Comment) (Wound Laceration head right-Closed Stitches )                       Personal Care Assistance Level of Assistance  Bathing, Feeding, Dressing Bathing Assistance: Maximum assistance Feeding assistance: Maximum assistance Dressing Assistance: Maximum assistance Total Care Assistance: Maximum assistance   Functional Limitations Info  Sight, Hearing, Speech Sight Info: Adequate Hearing Info: Adequate Speech Info: Adequate    SPECIAL CARE FACTORS FREQUENCY  PT (By licensed PT)     PT Frequency: Min 2X/week               Contractures Contractures Info: Not present    Additional Factors Info  Code Status, Allergies, Psychotropic Code Status Info: Fullcode Allergies Info: Bee Venom, Nutritional Supplements, Latex, Penicillins Cross Reactors, Sulfa Drugs Cross Reactors Psychotropic Info: Doyce ParaKlonopin, Zypreza         Current Medications (03/10/2017):  This is the current hospital active medication list Current Facility-Administered Medications  Medication Dose Route Frequency Provider Last Rate Last Dose  . acetaminophen (TYLENOL) tablet 650 mg  650 mg Oral Q6H PRN Michael Litterarter, Nikki, MD       Or  . acetaminophen (TYLENOL) suppository 650 mg  650 mg Rectal Q6H PRN Michael Litterarter, Nikki, MD      . aspirin EC tablet 81 mg  81 mg Oral Daily Michael Litterarter, Nikki, MD   81 mg at 03/10/17 1148  . carbidopa-levodopa (SINEMET IR) 25-100 MG per tablet immediate release 1 tablet  1 tablet Oral TID Michael Litterarter, Nikki, MD   1 tablet at 03/10/17 1148  . clonazepam (KLONOPIN) disintegrating tablet 0.25 mg  0.25 mg Oral BID Michael Litterarter, Nikki, MD   0.25 mg at 03/10/17 1148  . clonazePAM (KLONOPIN) tablet 0.5 mg  0.5 mg Oral BID PRN Michael Litterarter, Nikki, MD      . famotidine (PEPCID) tablet 20 mg  20 mg Oral BID Michael Litterarter, Nikki, MD   20 mg at 03/10/17 1148  . hydrochlorothiazide (MICROZIDE) capsule 12.5 mg  12.5 mg Oral Daily Albertine GratesXu, Santo Zahradnik, MD   12.5 mg at 03/10/17 1149  .  lactated ringers 1,000 mL with potassium chloride 10 mEq infusion   Intravenous Continuous Albertine Grates, MD 75 mL/hr at 03/09/17 2045    . magnesium hydroxide (MILK OF MAGNESIA) suspension 30 mL  30 mL Oral Daily PRN Michael Litter, MD      . MEDLINE mouth rinse  15 mL Mouth Rinse BID Michael Litter, MD   15 mL at 03/09/17 2249  . medroxyPROGESTERone (DEPO-PROVERA) injection 150 mg  150 mg Intramuscular Weekly Michael Litter, MD   150 mg at 03/07/17 1148  . metoprolol tartrate (LOPRESSOR) tablet 12.5 mg  12.5 mg Oral BID Albertine Grates, MD   12.5 mg at 03/10/17 1148  . OLANZapine zydis (ZYPREXA)  disintegrating tablet 5 mg  5 mg Oral QHS Michael Litter, MD   5 mg at 03/09/17 2249  . OLANZapine zydis (ZYPREXA) disintegrating tablet 5 mg  5 mg Oral BID PRN Michael Litter, MD      . ondansetron [facility]) tablet 4 mg  4 mg Oral Q6H PRN Michael Litter, MD       Or  . ondansetron [facility]) injection 4 mg  4 mg Intravenous Q6H PRN Michael Litter, MD      . pantoprazole (PROTONIX) EC tablet 40 mg  40 mg Oral Daily Michael Litter, MD   40 mg at 03/10/17 1149  . polyethylene glycol (MIRALAX / GLYCOLAX) packet 17 g  17 g Oral Daily Albertine Grates, MD   17 g at 03/10/17 1149  . potassium chloride SA (K-DUR,KLOR-CON) CR tablet 40 mEq  40 mEq Oral Once Albertine Grates, MD      . senna-docusate (Senokot-S) tablet 1 tablet  1 tablet Oral BID Albertine Grates, MD   1 tablet at 03/10/17 1149  . simvastatin (ZOCOR) tablet 10 mg  10 mg Oral QHS Michael Litter, MD   10 mg at 03/09/17 2248  . tamsulosin (FLOMAX) capsule 0.4 mg  0.4 mg Oral Daily Michael Litter, MD   0.4 mg at 03/10/17 1149  . traMADol (ULTRAM) tablet 50 mg  50 mg Oral TID PRN Michael Litter, MD      . Valproate Sodium (DEPAKENE) solution 1,250 mg  1,250 mg Oral QHS Michael Litter, MD   1,250 mg at 03/09/17 2247  . Valproate Sodium (DEPAKENE) solution 750 mg  750 mg Oral Daily Michael Litter, MD   750 mg at 03/10/17 1150     Discharge Medications: Please see discharge summary for a list of discharge medications.  Relevant Imaging Results:  Relevant Lab Results:   Additional Information SSN:[id number]  Clearance Coots, LCSW   Patient will resume Home Health PT/OT/Speech.

## 2017-03-10 NOTE — Progress Notes (Signed)
[facility] to group [facility]. Spoke to caregiver, [name] and they will need a bed that can be lowered close to the floor for safety reasons. Isidoro DonningAlesia Nessa Ramaker RN CCM Case Mgmt phone [phone]

## 2017-03-10 NOTE — Progress Notes (Signed)
Patient caretaker [name] at bedside working with patient. She reports the patient can return to group home.  CSW faxed FL2 information to [phone]. [facility] Sande RivesKaren Shaver.  No other needs identified at this time.   Vivi BarrackNicole Kalise Fickett, Theresia MajorsLCSWA, MSW Clinical Social Worker 5E and Psychiatric Service Line [phone]

## 2017-03-10 NOTE — Progress Notes (Signed)
CSW assisting with patient discharge to Group Home. CSW spoke with [name]-patient caretaker about discharge. She is "unsure if group home can take patient back due to inability to to ambulate without assistance." PT recommends SNF (unless foster home can manage pt. at current level) She reports at baseline patient able to ambulate with unsteady gait at times. She reports the patient did have difficulty walking prior to coming to ED, "that is the reason I brought him to the ER." She report the patient had a period about a year ago, "he did not walk." [facility] reports she plans to visit the patient to day to help determine disposition.   Vivi BarrackNicole Anthon Harpole, Theresia MajorsLCSWA, MSW Clinical Social Worker 5E and Psychiatric Service Line [phone] 03/10/2017  9:54 AM

## 2017-03-10 NOTE — Care Management Note (Signed)
Case Management Note  Patient Details  Name: William [name] MRN: [id number] Date of Birth: 06/24/1961  Subjective/Objective:    Agitation, Hypernatremia, AKI, Schizophrenia, Falls                Action/Plan: Discharge Planning: NCM spoke to Caregiver, [name] and pt is active with [facility] [place] [facility]. Contacted [facility] # [phone], [facility] to make aware HHRN, PT,OT and SLP were added. Will fax orders, facesheet and dc summary to  # [phone]. Contacted [facility] DME rep for RW.   PCP [name], [name]  Expected Discharge Date:  03/10/17               Expected Discharge Plan:  Home w Home Health Services  In-House Referral:  Clinical Social Work  Discharge planning Services  CM Consult  Post Acute Care Choice:  Home Health Choice offered to:  [facility] POA / Guardian  DME Arranged:  [place] rolling DME Agency:  [facility]  HH Arranged:  PT, RN, OT, Speech Therapy HH Agency:  Other - See comment  Status of Service:  Completed, signed off  If discussed at Long Length of Stay Meetings, dates discussed:    Additional Comments:  [name], [name], RN 03/10/2017, 12:54 PM

## 2017-03-10 NOTE — Care Management Important Message (Signed)
Important Message  Patient Details  Name: William [name] MRN: [id number] Date of Birth: 03/15/1961   Medicare Important Message Given:  Yes    [name], Zyen Triggs Ellen, RN 03/10/2017, 12:30 PM

## 2017-03-24 DIAGNOSIS — D509 Iron deficiency anemia, unspecified: Secondary | ICD-10-CM | POA: Diagnosis not present

## 2017-03-25 ENCOUNTER — Ambulatory Visit (INDEPENDENT_AMBULATORY_CARE_PROVIDER_SITE_OTHER): Payer: Medicare Other | Admitting: Cardiology

## 2017-03-25 ENCOUNTER — Encounter: Payer: Self-pay | Admitting: Cardiology

## 2017-03-25 VITALS — BP 114/68 | HR 76 | Ht 72.0 in | Wt 149.8 lb

## 2017-03-25 DIAGNOSIS — I480 Paroxysmal atrial fibrillation: Secondary | ICD-10-CM | POA: Diagnosis not present

## 2017-03-25 DIAGNOSIS — I10 Essential (primary) hypertension: Secondary | ICD-10-CM | POA: Diagnosis not present

## 2017-03-25 DIAGNOSIS — R9431 Abnormal electrocardiogram [ECG] [EKG]: Secondary | ICD-10-CM | POA: Diagnosis not present

## 2017-03-25 DIAGNOSIS — D638 Anemia in other chronic diseases classified elsewhere: Secondary | ICD-10-CM

## 2017-03-25 NOTE — Patient Instructions (Signed)

## 2017-03-25 NOTE — Progress Notes (Signed)
Cardiology Office Note:    Date:  03/25/2017   ID:  William [name], DOB [birth year], MRN [id number]  PCP:  Abigail Miyamoto, MD  Cardiologist:  Norman Herrlich, MD    Referring MD: Abigail Miyamoto,*    ASSESSMENT:    1. Paroxysmal atrial fibrillation (HCC)   2. Essential hypertension   3. Anemia, chronic disease    PLAN:    In order of problems listed above:  1. He has had a single brief episode of atrial fibrillation not documented on EKG strips is at low risk of stroke and I would not raise the issue of anticoagulation with multiple contraindications including anemia self harm unsteady gait and hypotension. I am unsure whether aspirin gives any benefit he's been on a long-term in ectopy discontinued. I would not restart rate limiting medications are an antiarrhythmic drug with his hypotension and falls. 2. He is off antihypertensives except for thiazide diuretic for nephrogenic diabetes insipidus, he may require a alpha-adrenergic agonist for his autonomic dysfunction and hypotension. 3. This is a relative to absolute contraindication to anticoagulation.   Next appointment: 6 months   Medication Adjustments/Labs and Tests Ordered: Current medicines are reviewed at length with the patient today.  Concerns regarding medicines are outlined above.  No orders of the defined types were placed in this encounter.  No orders of the defined types were placed in this encounter.   Chief Complaint  Patient presents with  . New Patient (Initial Visit)    after recent hospitalization at [facility]  . Atrial Fibrillation    History of Present Illness:    William [name] is a 56 y.o. male with a hx of Hypertension with recent [facility] admission withsecondary Parkinsons nephrogenic DI hypernatremia AKI on lithium and anemia with Hgb 8.0 felt to be due to chronic diseaseand  last seen For the new year ago. During recent admission noted to have PAF but not documented and a HR in 50-60  BPM .  He did not beta blocker for hypertension prior to May when he developed severe intermittent symptomatic hypotension and was discontinued. An hospital he had brief rate controlled atrial fibrillation transiently put on a beta blocker but not resumed after discharge. Since that time he is relatively hypotensive and may have autonomic dysfunction due to Parkinson's. He has had no syncope or. Chest pain or palpitation.  Progress note 03/06/17:5:15pm Addendum:  Tele showed afib, rate in the 90's, bp stable, no prior diagnosis of aifb, start low dose lopressor with holding parameters, chads2vasc score of 0-1, there is documented  h/o diabetes though he is not on any meds for this and no hyperglycemia in the hospital.  He is not a candidate for anticoagulation anyway due to h/o frequent falls, continue asa 81mg , will get echocardiogram if patient will be cooperative with the exam. Compliance with diet, lifestyle and medications: Yes Past Medical History:  Diagnosis Date  . Asthma   . Bipolar affective disorder (HCC)    chronic  . BPH (benign prostatic hyperplasia)   . Chronic bronchitis (HCC)   . Crohn's colitis (HCC)   . Diabetes mellitus    Type II  . GERD (gastroesophageal reflux disease)   . Hypercholesteremia   . Hypertension   . Incontinence of feces   . Insomnia   . Mental disorder    bipolar  . Mental retardation   . Osteoporosis   . Parkinsonism, secondary (HCC)   . Prostate hypertrophy   . PTSD (post-traumatic stress  disorder)   . Schizophrenia, chronic condition [facility](HCC)     Past Surgical History:  Procedure Laterality Date  . TONSILLECTOMY AND ADENOIDECTOMY     as child  . TOOTH EXTRACTION  10/22/2011   Procedure: DENTAL RESTORATION/EXTRACTIONS;  Surgeon: Georgia LopesScott M Jensen, DDS;  [facility];  Service: Oral Surgery;  Laterality: Bilateral;  . TRANSURETHRAL RESECTION OF PROSTATE     x 2    Current Medications: Current Meds  Medication Sig  . acetaminophen  (TYLENOL) 325 MG tablet Take 650 mg by mouth every 6 (six) hours as needed for moderate pain.  Marland Kitchen. alendronate (FOSAMAX) 70 MG tablet Take 70 mg by mouth once a week.   Marland Kitchen. aspirin EC 81 MG tablet Take 81 mg by mouth daily.  Marland Kitchen. azelastine (ASTELIN) 0.1 % nasal spray Place 1 spray into both nostrils 2 (two) times daily as needed for rhinitis or allergies. Use in each nostril as directed  . Benzocaine-Menthol (CEPACOL SORE THROAT) 15-3.6 MG LOZG Use as directed 1 lozenge in the mouth or throat every 2 (two) hours as needed (sore throat).  . bismuth subsalicylate (BISMATROL) 262 MG/15ML suspension Take 30 mLs by mouth every 6 (six) hours as needed.  . Calamine 8-8 % LOTN Apply 1 application topically as needed (rash).  . Calcium Carb-Cholecalciferol (CALCIUM-VITAMIN D) 600-400 MG-UNIT TABS Take 2 tablets by mouth daily.  . carbidopa-levodopa (SINEMET IR) 25-100 MG per tablet Take 1 tablet by mouth 3 (three) times daily.   . clonazePAM (KLONOPIN) 0.5 MG tablet Take 1 tablet (0.5 mg total) by mouth 4 (four) times daily as needed for anxiety (agitation, sleep).  . Dentifrices (SENSODYNE DT) Place onto teeth. Brush teeth twice a day.  . diphenhydrAMINE (BENADRYL) spray Apply 1 application topically every 6 (six) hours as needed for itching.  Marland Kitchen. ENSURE PLUS (ENSURE PLUS) LIQD Take 237 mLs by mouth 2 (two) times daily between meals.  Marland Kitchen. EPINEPHrine (EPI-PEN) 0.3 mg/0.3 mL DEVI Inject 0.3 mg into the muscle once. For allergic reaction to bees  . fexofenadine (ALLEGRA) 180 MG tablet Take 180 mg by mouth daily as needed for allergies or rhinitis.  . Glycerin (OASIS MOISTURIZING MOUTH SPRAY) 35 % LIQD Use as directed 2 sprays in the mouth or throat daily as needed (dry mouth).  . guaifenesin (ROBAFEN) 100 MG/5ML syrup Take 200 mg by mouth 3 (three) times daily as needed for cough.  . hydrocortisone 1 % lotion Apply 1 application topically 4 (four) times daily as needed for itching.  . hydrocortisone 1 % ointment Apply  1 application topically 4 (four) times daily as needed for itching.  . hydrocortisone cream 1 % Apply 1 application topically 4 (four) times daily as needed for itching.  Marland Kitchen. ibuprofen (ADVIL,MOTRIN) 200 MG tablet Take 400 mg by mouth every 8 (eight) hours as needed for moderate pain.  . magnesium hydroxide (MILK OF MAGNESIA) 400 MG/5ML suspension Take 30 mLs by mouth daily as needed for mild constipation.  . medroxyPROGESTERone (DEPO-PROVERA) 150 MG/ML injection Inject 150 mg into the muscle once a week. On fridays  . Miconazole Nitrate (NEOSPORIN AF EX) Apply 1 application topically 3 (three) times daily as needed (minor cuts).  . neomycin-bacitracin-polymyxin (NEOSPORIN) [phone] ointment Apply 1 application topically 3 (three) times daily as needed (minor cuts).  . OLANZapine zydis (ZYPREXA) 5 MG disintegrating tablet Take 5 mg by mouth at bedtime.  Marland Kitchen. OLANZapine zydis (ZYPREXA) 5 MG disintegrating tablet Take 5 mg by mouth 2 (two) times daily as needed (agitation).  .Marland Kitchen  omeprazole (PRILOSEC) 20 MG capsule Take 20 mg by mouth 2 (two) times daily.  . phenazopyridine (URISTAT) 95 MG tablet Take 95 mg by mouth 3 (three) times daily as needed for pain.  . phenol (CHLORASEPTIC) 1.4 % LIQD Use as directed 1 spray in the mouth or throat every 2 (two) hours as needed for throat irritation / pain.  . polyethylene glycol (MIRALAX / GLYCOLAX) packet Take 17 g by mouth daily.  . potassium chloride (K-DUR) 10 MEQ tablet Take 1 tablet (10 mEq total) by mouth daily.  . povidone-iodine (BETADINE) 10 % external solution Apply 1 application topically as needed for wound care.  . pseudoephedrine (SUDAFED) 30 MG tablet Take 30-60 mg by mouth every 4 (four) hours as needed for congestion.  . ranitidine (ZANTAC) 150 MG tablet Take 150 mg by mouth daily.   . Selenium Sulfide (SELSUN BLUE EX) Apply 1 application topically daily. Shampoo once a day  . senna-docusate (SENOKOT-S) 8.6-50 MG tablet Take 1 tablet by mouth at  bedtime.  . simvastatin (ZOCOR) 10 MG tablet Take 10 mg by mouth at bedtime.  . Sunscreens (COPPERTONE KIDS SPF15 EX) Apply 1 application topically daily as needed (prevent sunburn).  . tamsulosin (FLOMAX) 0.4 MG CAPS capsule Take 0.4 mg by mouth daily.  Marland Kitchen tolnaftate (TINACTIN) 1 % powder Apply 1 application topically daily as needed for itching.  . tolnaftate (TINACTIN) 1 % spray Apply 1 spray topically daily as needed (itching).  . traMADol (ULTRAM) 50 MG tablet Take 50 mg by mouth 3 (three) times daily as needed for moderate pain or severe pain.  . Valproic Acid (DEPAKENE) 250 MG/5ML SYRP syrup Take 1,250 mg by mouth at bedtime.   . Valproic Acid (DEPAKENE) 250 MG/5ML SYRP syrup Take 750 mg by mouth every morning.   . Wheat Dextrin (BENEFIBER PO) Take 15 mLs by mouth daily as needed (constipation).     Allergies:   Bee venom; Nutritional supplements; Latex; Lithium; Penicillins cross reactors; and Sulfa drugs cross reactors   Social History   Social History  . Marital status: Single    Spouse name: N/A  . Number of children: N/A  . Years of education: N/A   Occupational History  . disabled    Social History Main Topics  . Smoking status: Never Smoker  . Smokeless tobacco: Never Used  . Alcohol use No  . Drug use: No  . Sexual activity: Not Asked   Other Topics Concern  . None   Social History Narrative   Single, disabled, lives in home with full time caregiver- [name]   Right handed   caffeine use - none     Family History: The patient's family history includes Dementia in his mother; Diabetes in his father and mother; Heart disease in his father and mother. ROS:   Please see the history of present illness.    All other systems reviewed and are negative.  EKGs/Labs/Other Studies Reviewed:   Recent admission records and Fu PCP visit and labs reviewed prior to this visit. The following studies were reviewed today: Echo: Mild LVH with LVEF 55-60% and grossly normal  diastolic function. Mildly calcified mitral annulus with trivial mitral regurgitation. Mildly dilated aortic root. Sclerotic aortic valve with mild aortic regurgitation. Mild tricuspid regurgitation with PASP estimated 22 mmHg. EKG:  EKG ordered today.  The ekg ordered today demonstrates SRTH. Possible old ASMI EKG 6/27 and 6/28 both showed SRTH possible old ASMI Recent Labs: 03/11/17  Hgb 8.6, BMP normal 03/05/2017: ALT  39 03/06/2017: TSH 3.874 03/10/2017: BUN 16; Creatinine, Ser 0.94; Hemoglobin 8.9; Magnesium 2.2; Platelets 169; Potassium 3.5; Sodium 142  Recent Lipid Panel No results found for: CHOL, TRIG, HDL, CHOLHDL, VLDL, LDLCALC, LDLDIRECT  Physical Exam:    VS:  BP 114/68 (BP Location: Left Arm)   Pulse 76   Ht 6' (1.829 m)   Wt 149 lb 12.8 oz (67.9 kg)   SpO2 98%   BMI 20.32 kg/m     Wt Readings from Last 3 Encounters:  03/25/17 149 lb 12.8 oz (67.9 kg)  03/10/17 157 lb (71.2 kg)  04/12/15 156 lb 12.8 oz (71.1 kg)     GEN:  Well nourished, well developed in no acute distress HEENT: Normal NECK: No JVD; No carotid bruits LYMPHATICS: No lymphadenopathy CARDIAC: RRR, no murmurs, rubs, gallops RESPIRATORY:  Clear to auscultation without rales, wheezing or rhonchi  ABDOMEN: Soft, non-tender, non-distended MUSCULOSKELETAL:  No edema; No deformity  SKIN: Warm and dry NEUROLOGIC:  Alert and oriented x 3 PSYCHIATRIC:  Normal affect  Marked tremor at rest  Signed, Norman Herrlich, MD  03/25/2017 8:29 AM    [facility]

## 2017-03-31 DIAGNOSIS — D539 Nutritional anemia, unspecified: Secondary | ICD-10-CM

## 2017-07-01 DIAGNOSIS — D539 Nutritional anemia, unspecified: Secondary | ICD-10-CM

## 2017-12-01 ENCOUNTER — Encounter: Payer: Self-pay | Admitting: Gastroenterology

## 2018-01-19 LAB — HM DEXA SCAN

## 2019-01-05 IMAGING — CR DG HIP (WITH OR WITHOUT PELVIS) 2-3V*L*
3 series · 3 of 3 positions shown · non-contrast
Comparison: None.

CLINICAL DATA: Multiple falls recently.

EXAM:
DG HIP (WITH OR WITHOUT PELVIS) 2-3V LEFT

[x pelvis (1 of 2)]
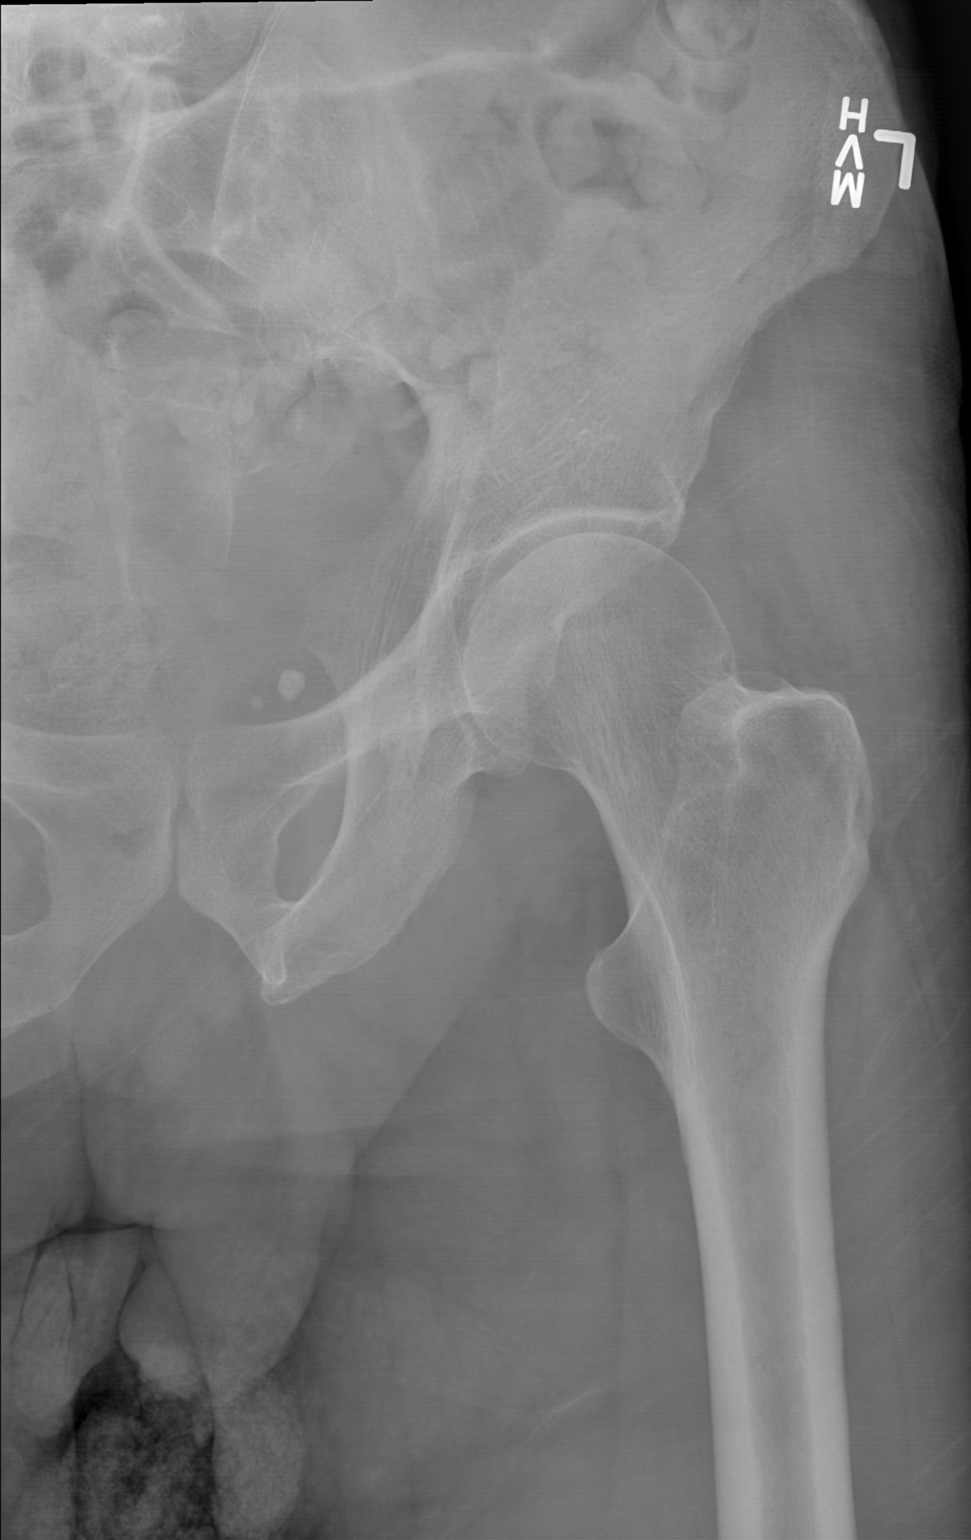

[x pelvis (2 of 2)]
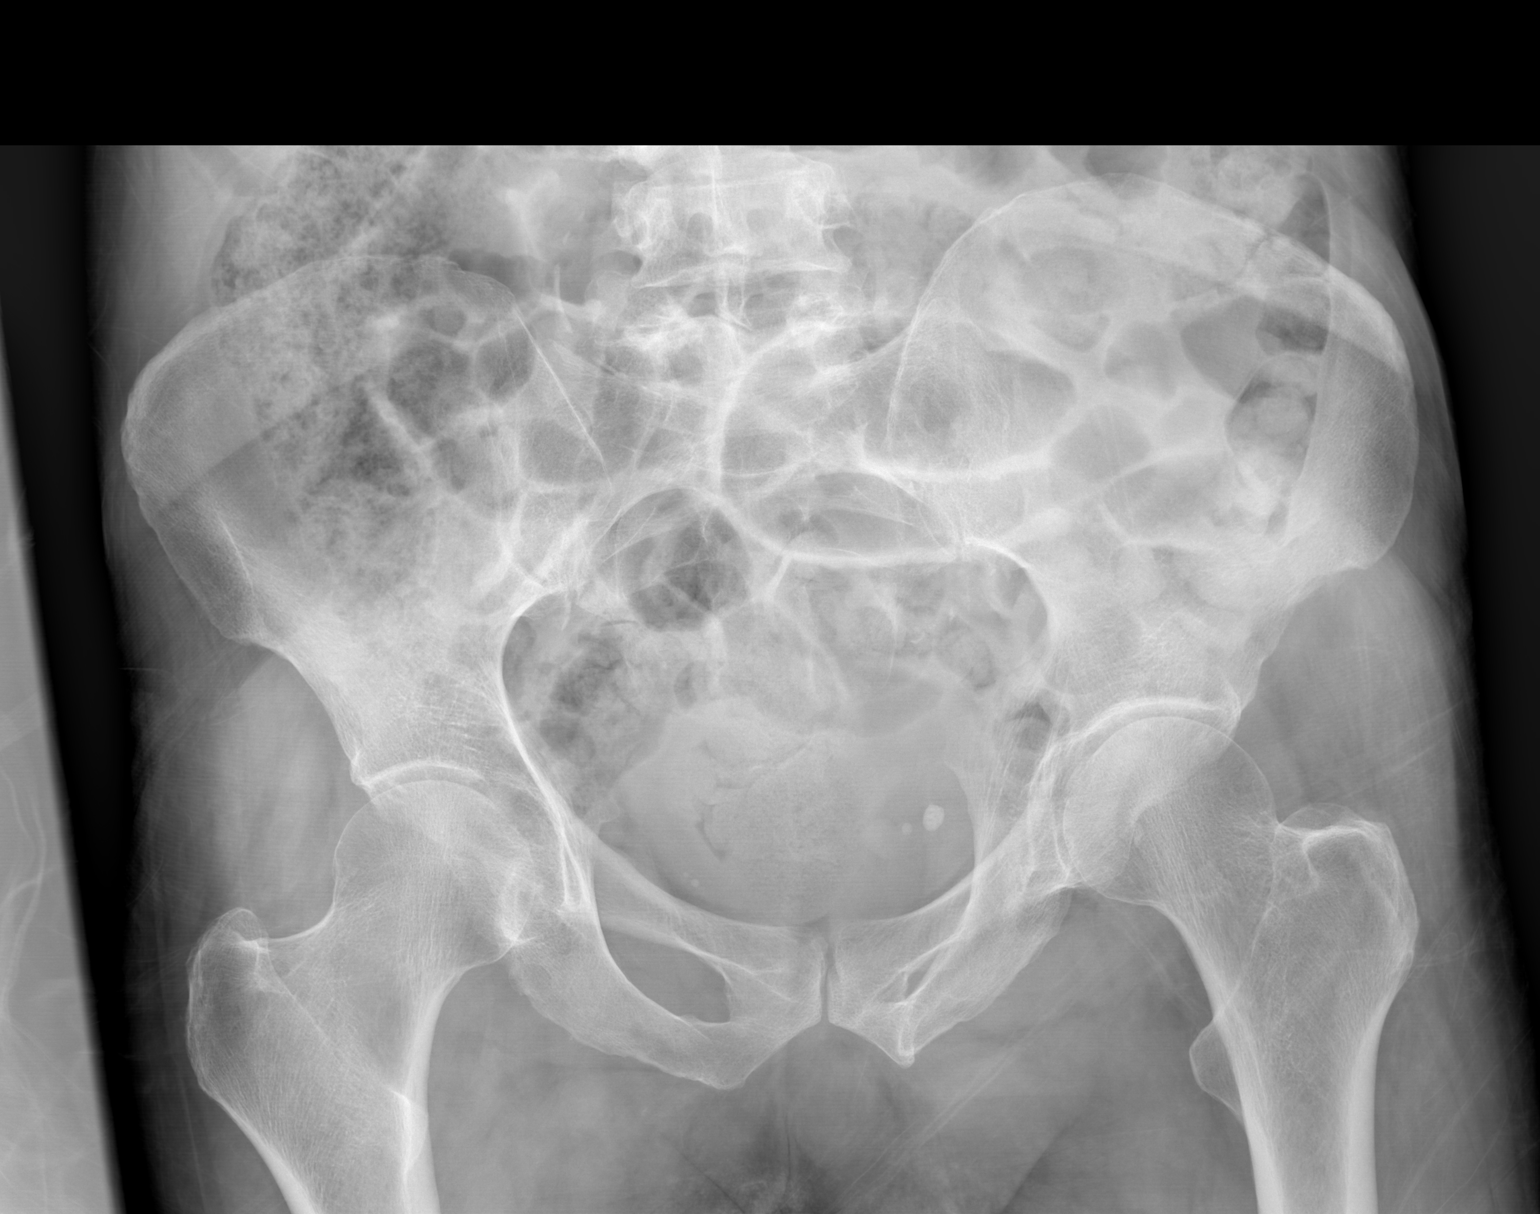

[x hip lat left]
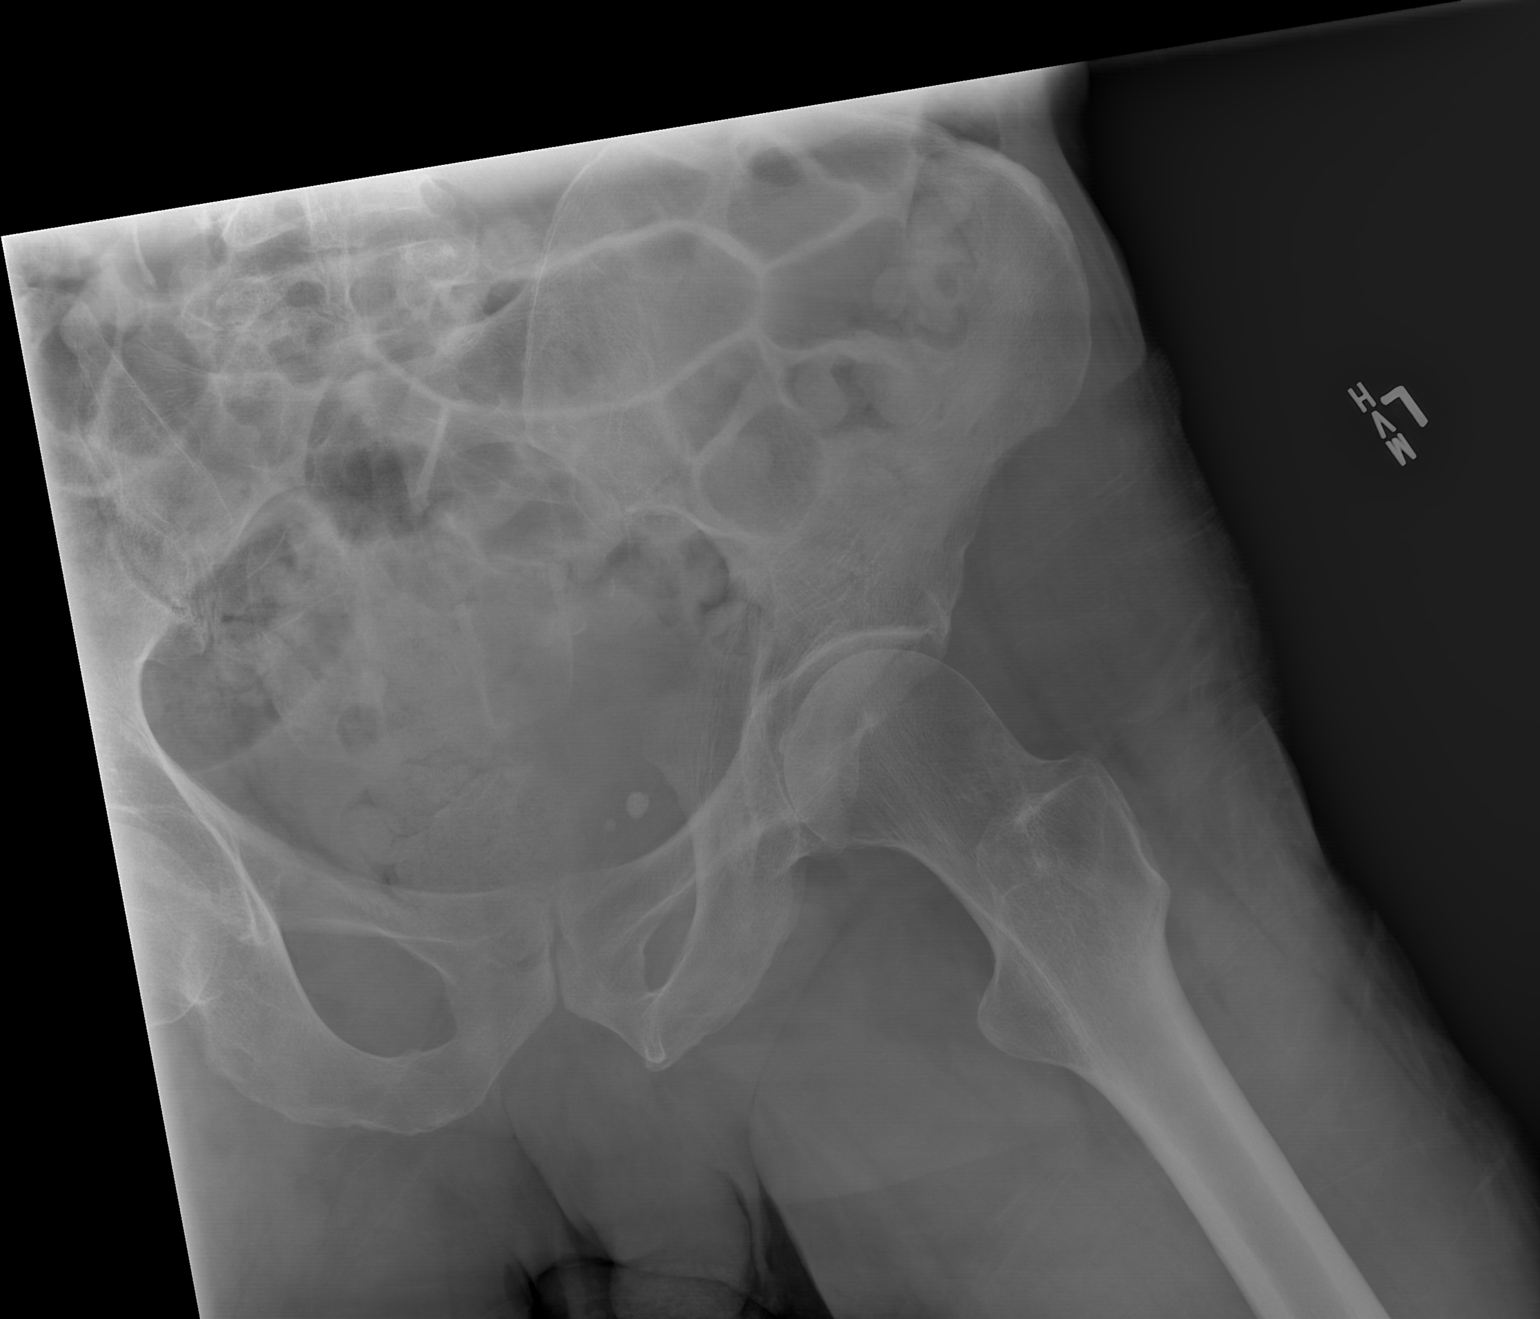

[3 of 3 positions shown; findings below may reference images not displayed]

FINDINGS: Examination is minimally degraded due to obliquity.

No definite displaced hip or pelvic fracture. Left hip joint spaces
appear preserved. No evidence avascular necrosis.

Limited visualization the contralateral right hip is normal.

Punctate phleboliths overlie the lower pelvis bilaterally, left
greater than right.

Moderate colonic stool burden without evidence of enteric
obstruction.
IMPRESSION: No definite displaced hip or pelvic fracture.

## 2019-01-05 IMAGING — CR DG CHEST 2V
2 series · 2 of 2 positions shown · non-contrast
Comparison: Chest radiograph 08/08/2015.

CLINICAL DATA: Altered mental status. Multiple falls. Aggressive
behavior.

EXAM:
CHEST  2 VIEW

[x chest ap]
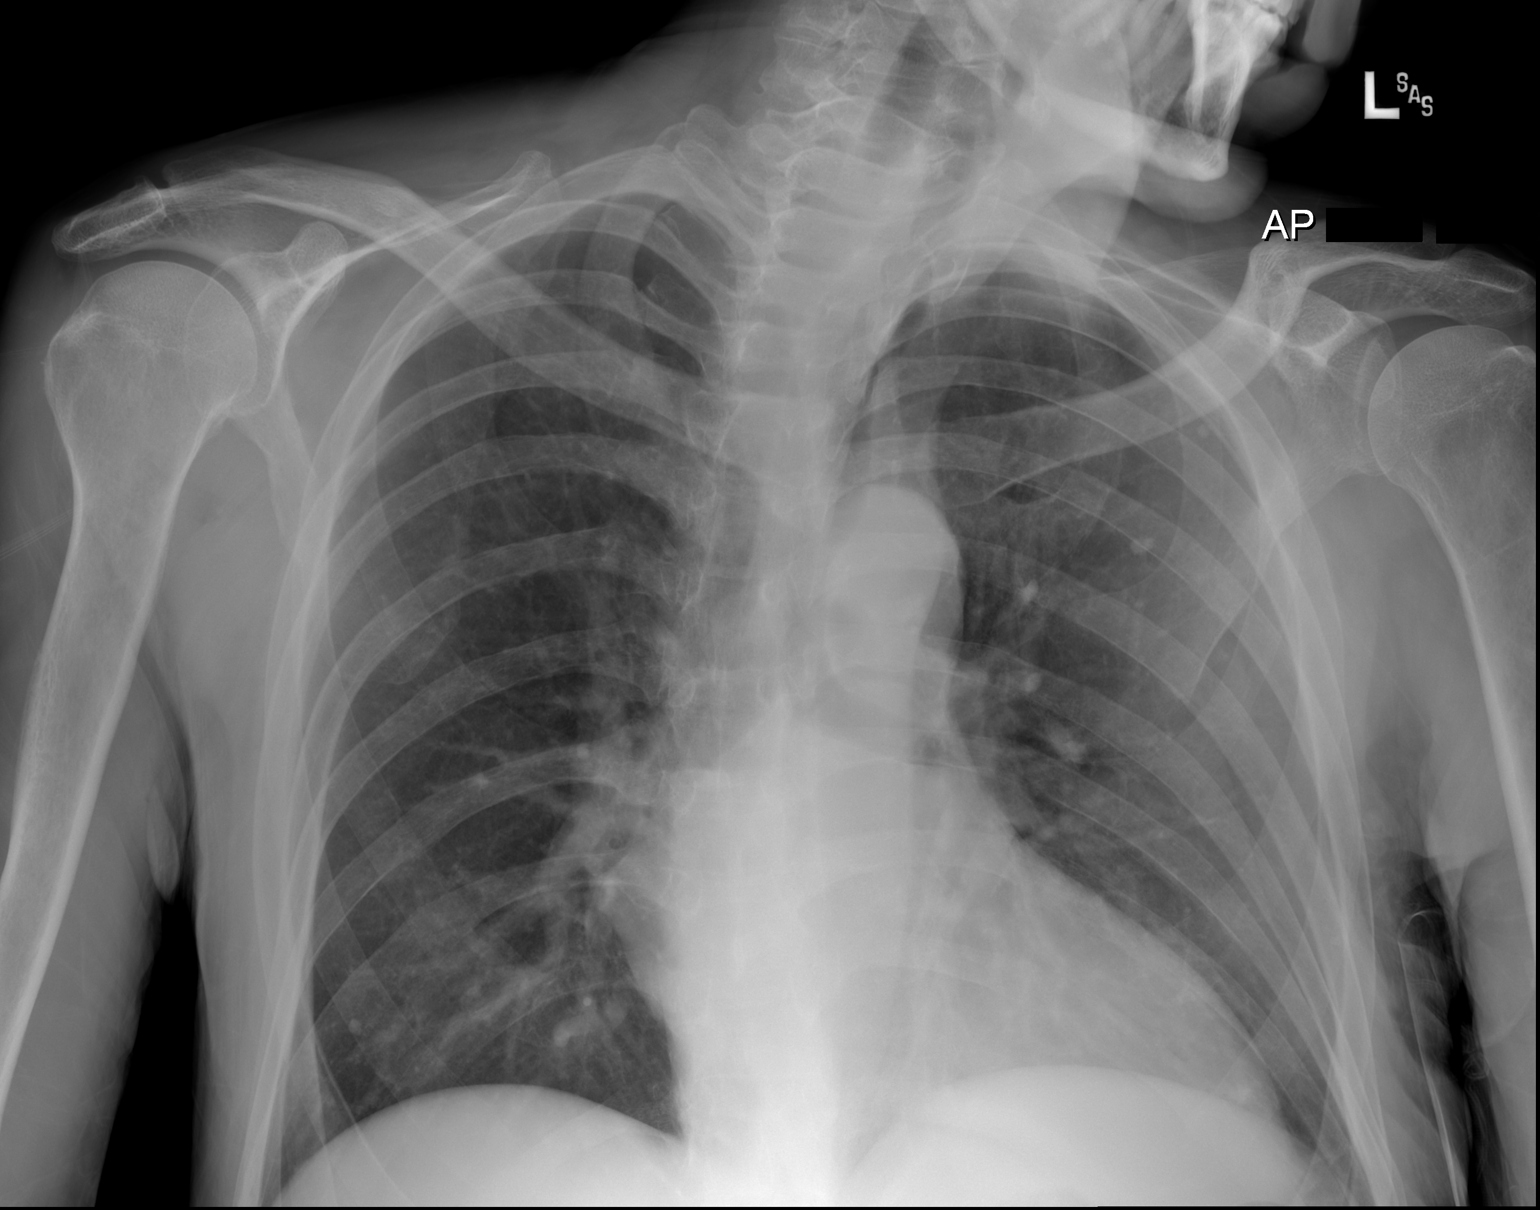

[w chest lat]
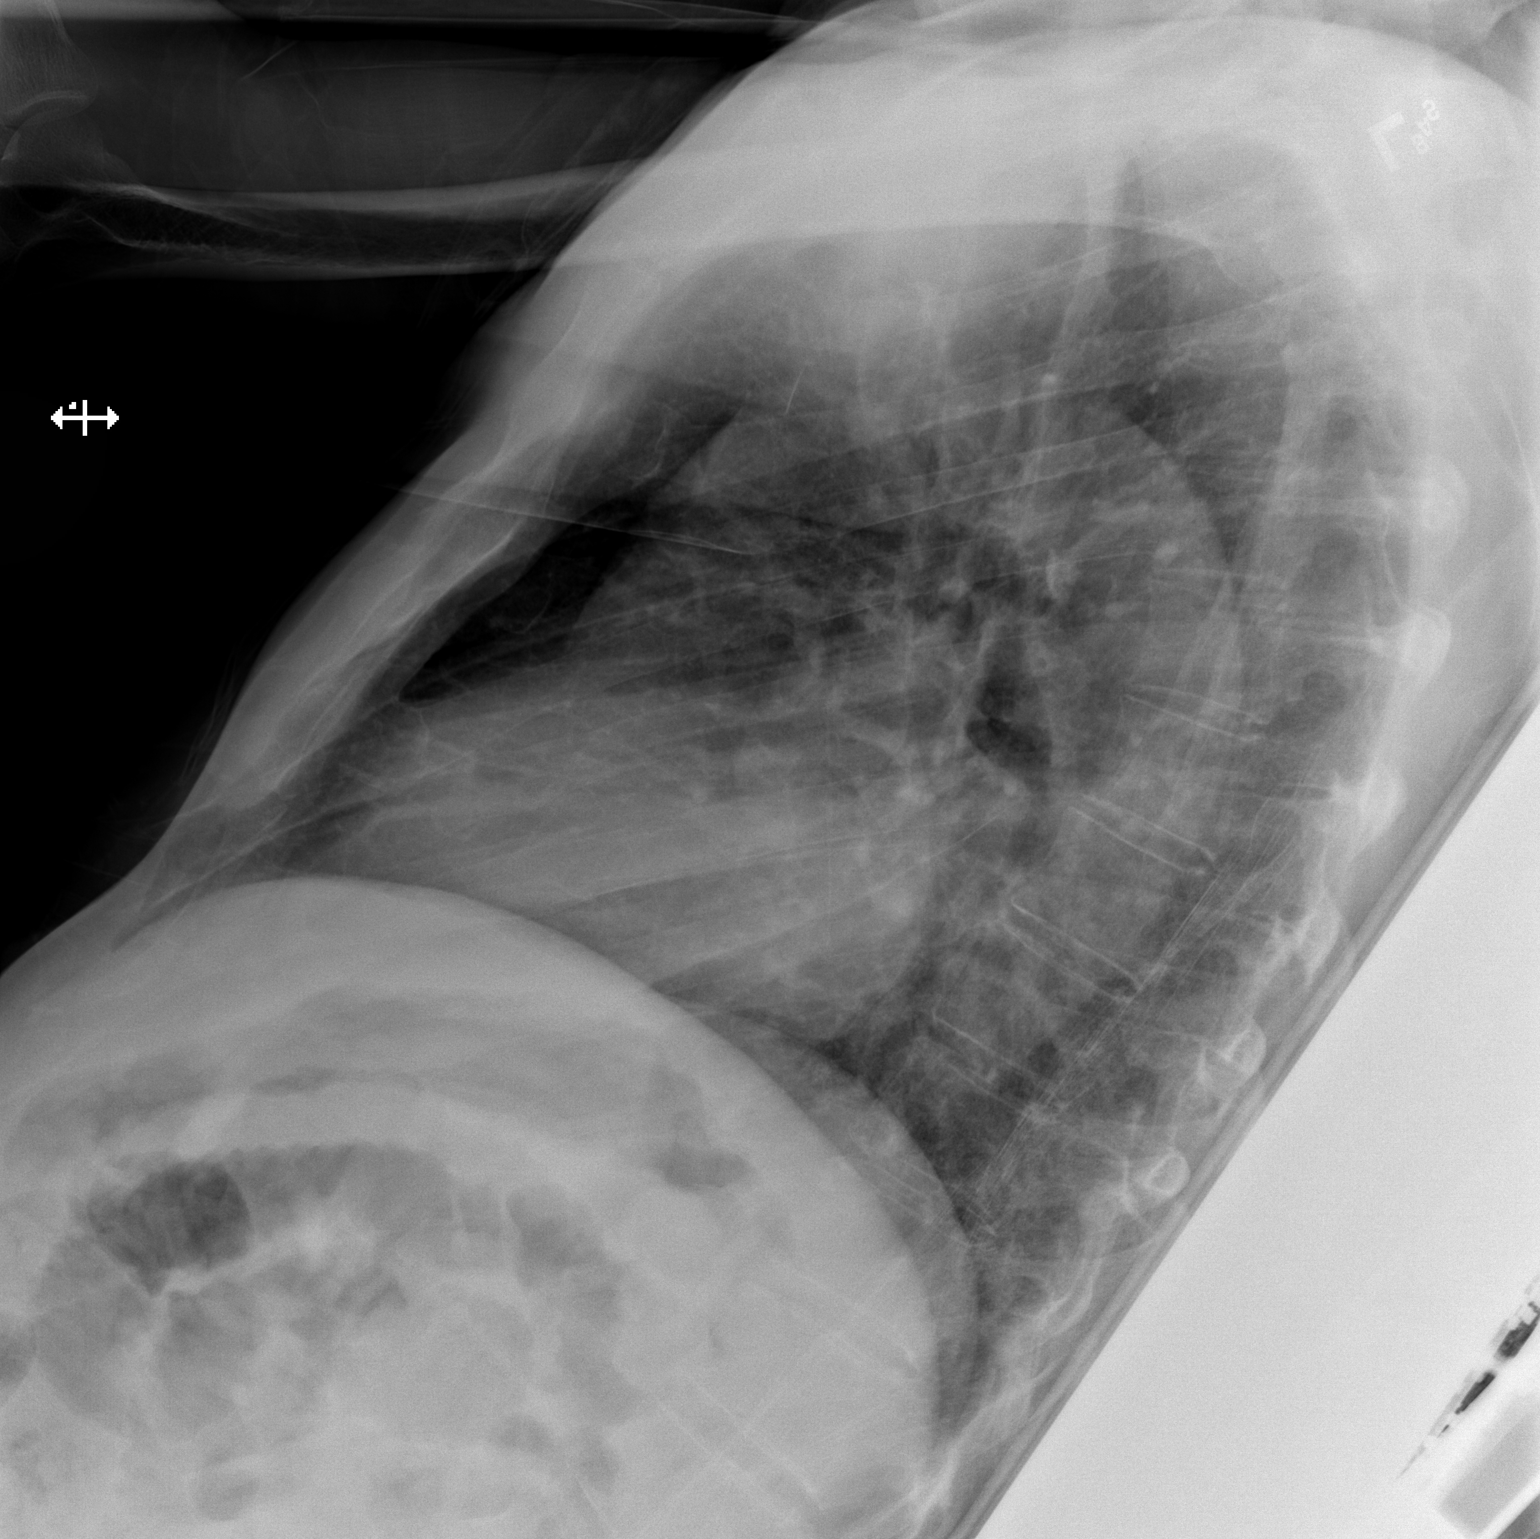

[2 of 2 positions shown; findings below may reference images not displayed]

FINDINGS: The heart size and mediastinal contours are within normal limits.
Both lungs are clear. Scattered granulomata. The visualized skeletal
structures are unremarkable.
IMPRESSION: No active cardiopulmonary disease.  Stable exam.

## 2019-01-05 IMAGING — CT CT HEAD W/O CM
3 of 4 series · 16 of 47 positions shown, 19 images · non-contrast
Comparison: 08/08/2015

CLINICAL DATA: Multiple falls.

EXAM:
CT HEAD WITHOUT CONTRAST
TECHNIQUE: Contiguous axial images were obtained from the base of the skull
through the vertex without intravenous contrast.

[Series 2: head w/o · axial · non-contrast · 0.45mm/px · z∈[-47,+73]mm · 10 of 30 slices shown, 13 images]
[im 3/30  brain]
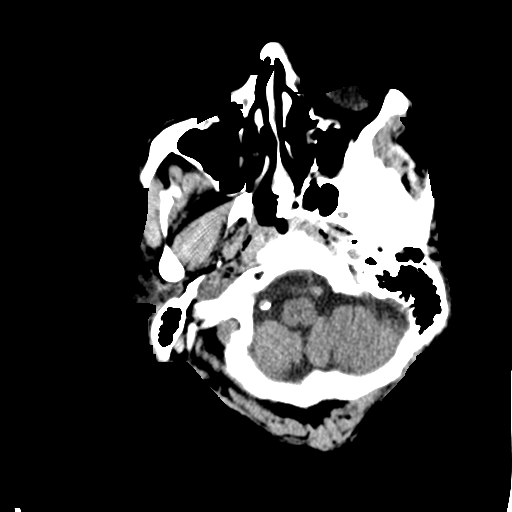
[im 3/30  bone]
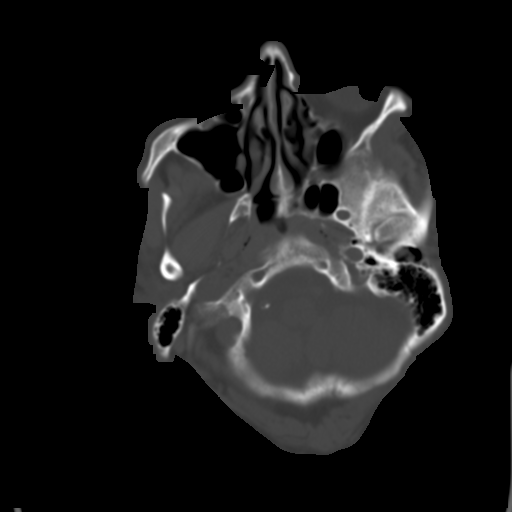
[im 5/30  brain]
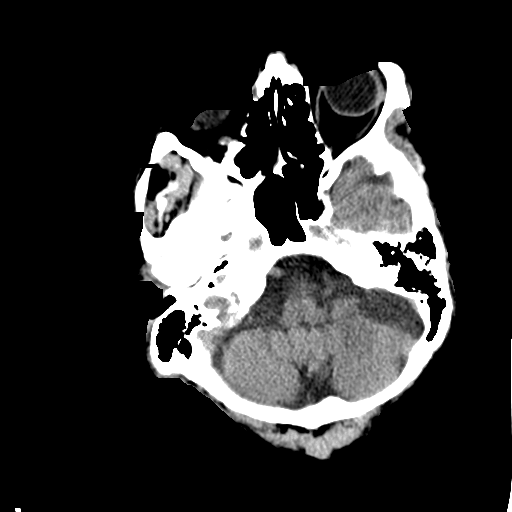
[im 9/30  brain]
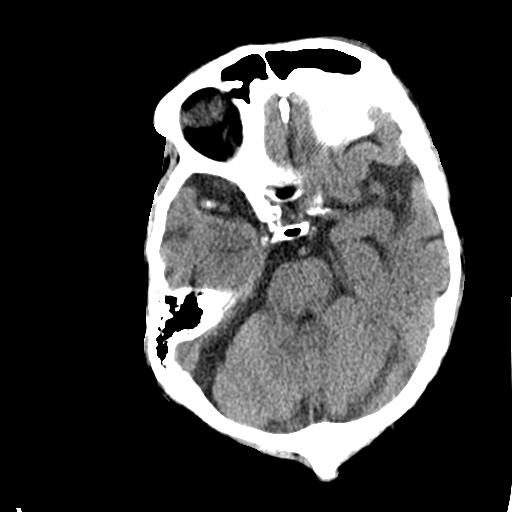
[im 11/30  brain]
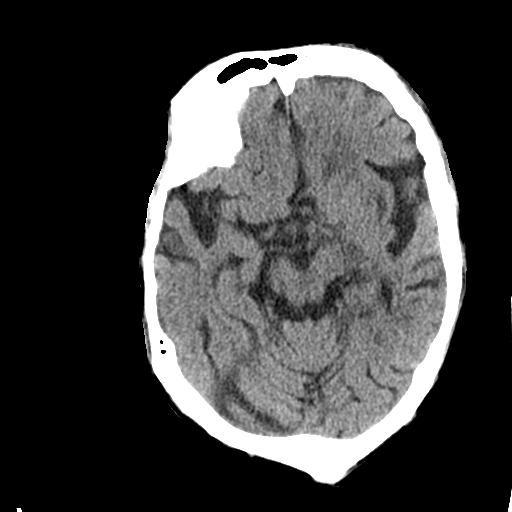
[im 13/30  brain]
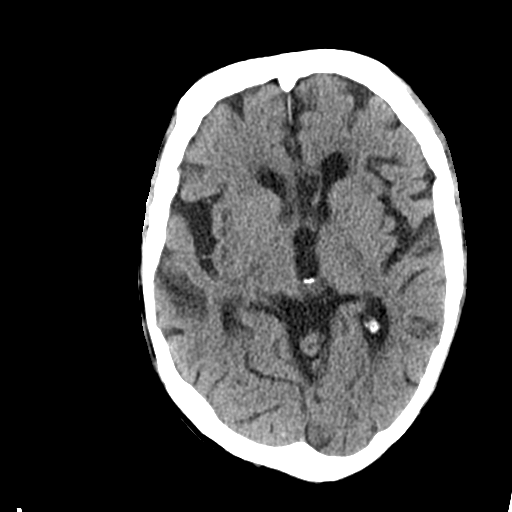
[im 13/30  bone]
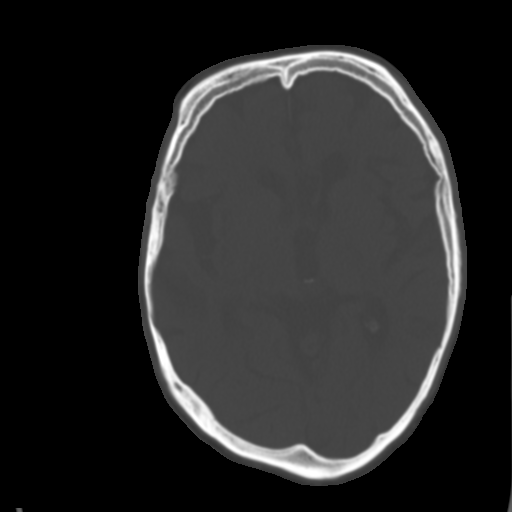
[im 17/30  brain]
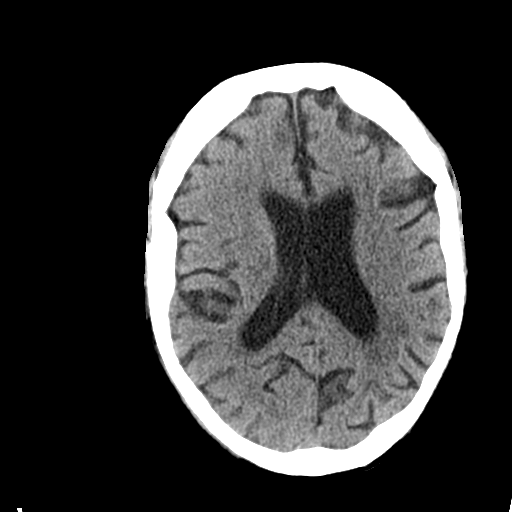
[im 19/30  brain]
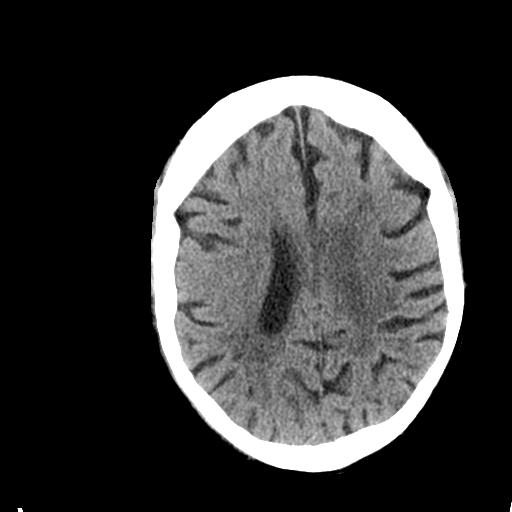
[im 21/30  brain]
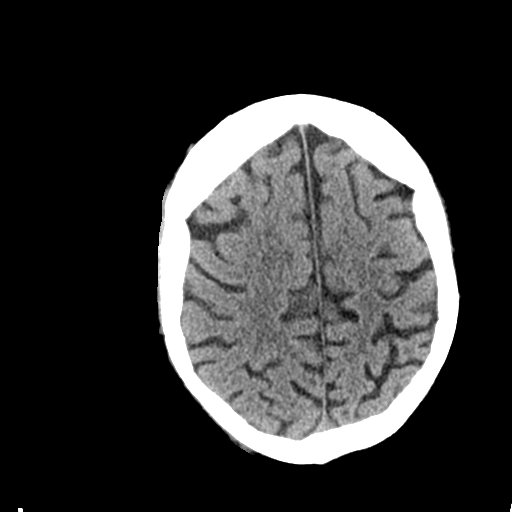
[im 25/30  brain]
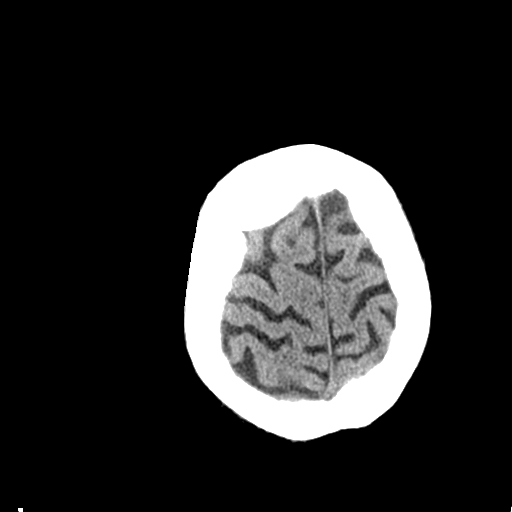
[im 25/30  bone]
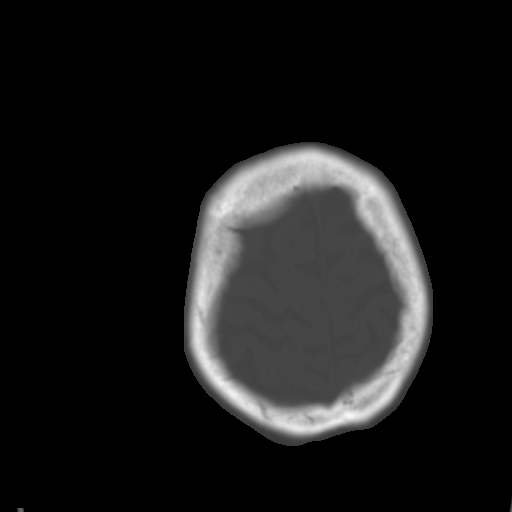
[im 27/30  brain]
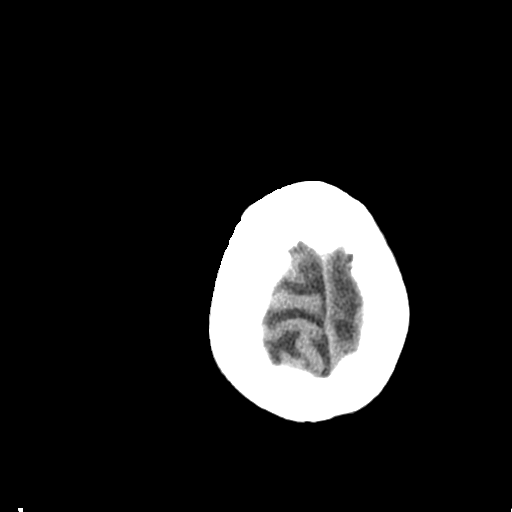

[Series 4: coronal · coronal · 0.31mm/px · 3 of 66 slices shown]
[im 22/66  brain]
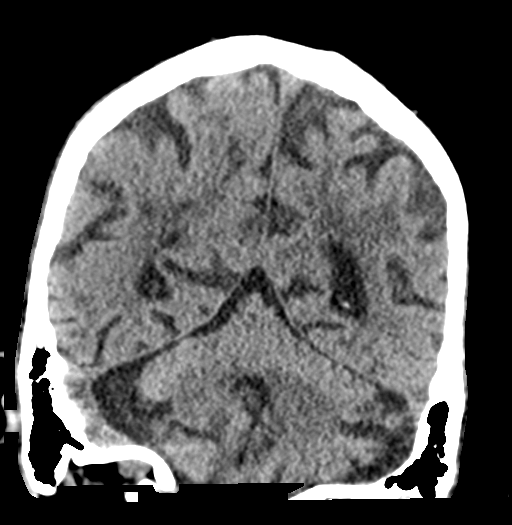
[im 29/66  brain]
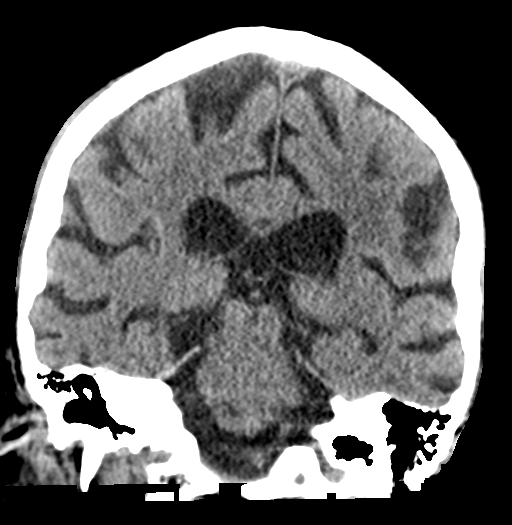
[im 37/66  brain]
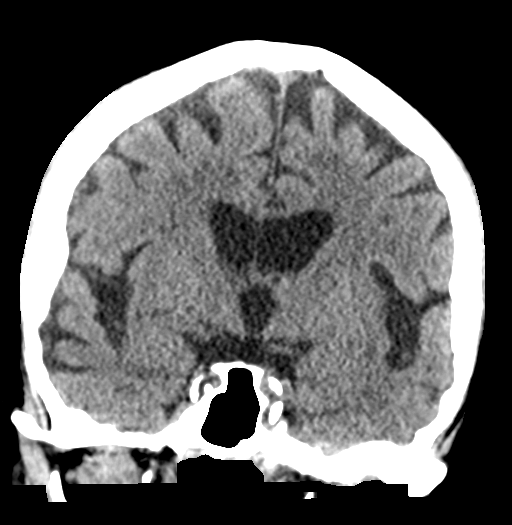

[Series 5: sagittal · sagittal · 0.31mm/px · 3 of 53 slices shown]
[im 18/53  brain]
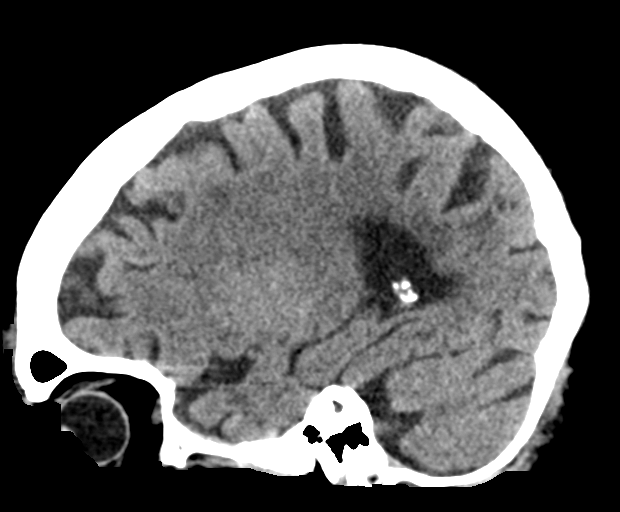
[im 27/53  brain]
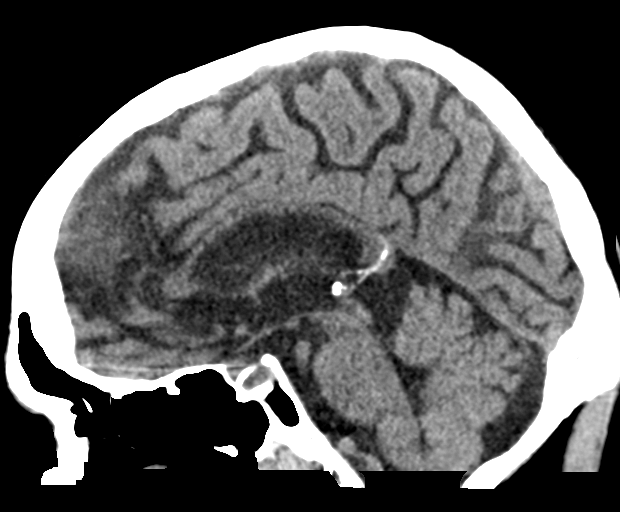
[im 35/53  brain]
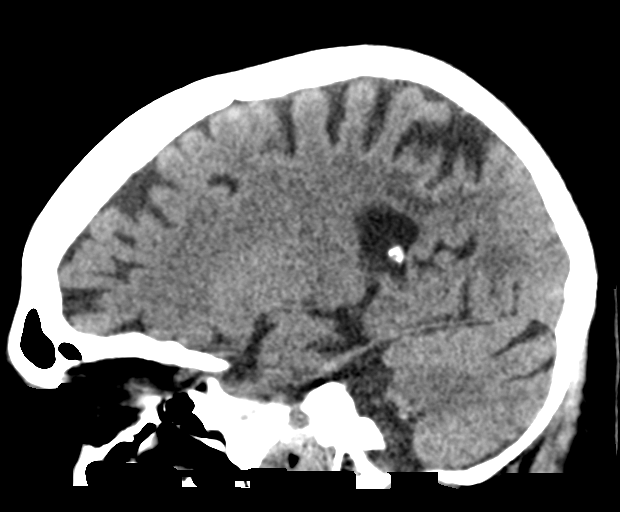

[16 of 47 positions shown; findings below may reference images not displayed]

FINDINGS: Brain: Similar findings of atrophy with sulcal prominence
centralized volume loss. Scattered periventricular hypodensities
compatible with microvascular ischemic disease. Given background
parenchymal abnormalities, there is no CT evidence superimposed
acute large territory infarct. No intraparenchymal or extra-axial
mass or hemorrhage. Normal size and configuration of the ventricles
and the basilar cisterns. No midline shift.

Vascular: Intracranial atherosclerosis.

Skull: No displaced calvarial fracture.

Sinuses/Orbits: Limited visualization the paranasal sinuses and
mastoid air cells is normal. No air-fluid levels.

Other: Regional soft tissues appear normal.
IMPRESSION: Similar findings of atrophy and microvascular ischemic disease
without acute intracranial process.

## 2019-09-26 DIAGNOSIS — I119 Hypertensive heart disease without heart failure: Secondary | ICD-10-CM | POA: Insufficient documentation

## 2019-09-26 NOTE — Progress Notes (Signed)
Cardiology Office Note:    Date:  09/28/2019   ID:  William [name], DOB [birth year], MRN [id number]  PCP:  Lillard Anes, MD  Cardiologist:  Shirlee More, MD    Referring MD: Lillard Anes,*    ASSESSMENT:    1. Paroxysmal atrial fibrillation (HCC)   2. Nonrheumatic aortic valve insufficiency   3. Enlarged thoracic aorta ([place])   4. Hypertensive heart disease without heart failure    PLAN:    In order of problems listed above:  1. Stable no recurrent atrial fibrillation at this time I would not revisit the issue of anticoagulation without clinical recurrence of atrial fibrillation he takes low-dose aspirin and has close supervision his outpatient group home 2. Stable by physical exam no murmur I do not feel the need to repeat an echocardiogram at this time Next visit consider echo to look at both aortic regurgitation and thoracic aorta 3. Stable blood pressure at target currently without antihypertensive agents.   Next appointment: 1 year   Medication Adjustments/Labs and Tests Ordered: Current medicines are reviewed at length with the patient today.  Concerns regarding medicines are outlined above.  No orders of the defined types were placed in this encounter.  No orders of the defined types were placed in this encounter.   Chief Complaint  Patient presents with  . Annual Exam    History of Present Illness:    William [name] is a 59 y.o. 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  He was noted during hospitalization to have atrial fibrillation and was last seen 03/25/2017.William [name]  Review hospital records 03/06/2017 showed telemetry with atrial fibrillation heart rate in the range of 90 bpm and felt not to be a candidate for anticoagulation because of frequent falls. Compliance with diet, lifestyle and medications: Yes  Echocardiogram 03/08/2017 showed mild concentric left ventricular remodeling normal systolic  function ejection fraction 55 to 60%.  There is also mild aortic regurgitation mild enlargement of the aortic root and the left and right atrium both normal in size.  He is seen along with a healthcare provider.  He has had nothing to suggest recurrent atrial fibrillation no palpitations syncope no falls no chest pain shortness of breath or edema.  He has had no interim hospitalizations.  Medications were reviewed. Past Medical History:  Diagnosis Date  . Asthma   . Bipolar affective disorder (HCC)    chronic  . BPH (benign prostatic hyperplasia)   . Chronic bronchitis ([place])   . Crohn's colitis ([place])   . Diabetes mellitus    Type II  . GERD (gastroesophageal reflux disease)   . Hypercholesteremia   . Hypertension   . Incontinence of feces   . Insomnia   . Mental disorder    bipolar  . Mental retardation   . Osteoporosis   . Parkinsonism, secondary ([place])   . Prostate hypertrophy   . PTSD (post-traumatic stress disorder)   . Schizophrenia, chronic condition [facility])     Past Surgical History:  Procedure Laterality Date  . TONSILLECTOMY AND ADENOIDECTOMY     as child  . TOOTH EXTRACTION  10/22/2011   Procedure: DENTAL RESTORATION/EXTRACTIONS;  Surgeon: Gae Bon, DDS;  Location: [place];  Service: Oral Surgery;  Laterality: Bilateral;  . TRANSURETHRAL RESECTION OF PROSTATE     x 2    Current Medications: Current Meds  Medication Sig  . acetaminophen (TYLENOL) 325 MG tablet Take 650 mg by mouth every  6 (six) hours as needed for moderate pain.  William [name] alendronate (FOSAMAX) 70 MG tablet Take 70 mg by mouth once a week.   William [name] aspirin EC 81 MG tablet Take 81 mg by mouth daily.  William [name] azelastine (ASTELIN) 0.1 % nasal spray Place 1 spray into both nostrils 2 (two) times daily as needed for rhinitis or allergies. Use in each nostril as directed  . Benzocaine-Menthol (CEPACOL SORE THROAT) 15-3.6 MG LOZG Use as directed 1 lozenge in the mouth or throat every 2 (two) hours as needed (sore throat).   . bismuth subsalicylate (BISMATROL) 262 MG/15ML suspension Take 30 mLs by mouth every 6 (six) hours as needed.  . Calamine 8-8 % LOTN Apply 1 application topically as needed (rash).  . Calcium Carb-Cholecalciferol (CALCIUM-VITAMIN D) 600-400 MG-UNIT TABS Take 2 tablets by mouth daily.  . carbidopa-levodopa (SINEMET IR) 25-100 MG per tablet Take 1 tablet by mouth 3 (three) times daily.   . clonazePAM (KLONOPIN) 0.5 MG tablet Take 1 tablet (0.5 mg total) by mouth 4 (four) times daily as needed for anxiety (agitation, sleep).  . Dentifrices (SENSODYNE DT) Place onto teeth. Brush teeth twice a day.  . diphenhydrAMINE (BENADRYL) spray Apply 1 application topically every 6 (six) hours as needed for itching.  William [name] ENSURE PLUS (ENSURE PLUS) LIQD Take 237 mLs by mouth 2 (two) times daily between meals.  William [name] EPINEPHrine (EPI-PEN) 0.3 mg/0.3 mL DEVI Inject 0.3 mg into the muscle once. For allergic reaction to bees  . fexofenadine (ALLEGRA) 180 MG tablet Take 180 mg by mouth daily as needed for allergies or rhinitis.  . Glycerin (OASIS MOISTURIZING MOUTH SPRAY) 35 % LIQD Use as directed 2 sprays in the mouth or throat daily as needed (dry mouth).  . guaifenesin (ROBAFEN) 100 MG/5ML syrup Take 200 mg by mouth 3 (three) times daily as needed for cough.  . hydrochlorothiazide (HYDRODIURIL) 25 MG tablet Take 1 tablet by mouth 2 (two) times daily.  . hydrocortisone 1 % lotion Apply 1 application topically 4 (four) times daily as needed for itching.  . hydrocortisone 1 % ointment Apply 1 application topically 4 (four) times daily as needed for itching.  . hydrocortisone cream 1 % Apply 1 application topically 4 (four) times daily as needed for itching.  William [name] ibuprofen (ADVIL,MOTRIN) 200 MG tablet Take 400 mg by mouth every 8 (eight) hours as needed for moderate pain.  . magnesium hydroxide (MILK OF MAGNESIA) 400 MG/5ML suspension Take 30 mLs by mouth daily as needed for mild constipation.  . medroxyPROGESTERone  (DEPO-PROVERA) 150 MG/ML injection Inject 150 mg into the muscle once a week. On fridays  . Miconazole Nitrate (NEOSPORIN AF EX) Apply 1 application topically 3 (three) times daily as needed (minor cuts).  . neomycin-bacitracin-polymyxin (NEOSPORIN) [phone] ointment Apply 1 application topically 3 (three) times daily as needed (minor cuts).  . OLANZapine zydis (ZYPREXA) 5 MG disintegrating tablet Take 5 mg by mouth at bedtime.  William [name] OLANZapine zydis (ZYPREXA) 5 MG disintegrating tablet Take 5 mg by mouth 2 (two) times daily as needed (agitation).  William [name] omeprazole (PRILOSEC) 20 MG capsule Take 20 mg by mouth 2 (two) times daily.  . phenazopyridine (URISTAT) 95 MG tablet Take 95 mg by mouth 3 (three) times daily as needed for pain.  . phenol (CHLORASEPTIC) 1.4 % LIQD Use as directed 1 spray in the mouth or throat every 2 (two) hours as needed for throat irritation / pain.  . polyethylene glycol (MIRALAX / GLYCOLAX) packet Take 17 g by mouth  daily.  . potassium chloride (K-DUR) 10 MEQ tablet Take 1 tablet (10 mEq total) by mouth daily.  . povidone-iodine (BETADINE) 10 % external solution Apply 1 application topically as needed for wound care.  . pseudoephedrine (SUDAFED) 30 MG tablet Take 30-60 mg by mouth every 4 (four) hours as needed for congestion.  . ranitidine (ZANTAC) 150 MG tablet Take 150 mg by mouth daily.   . Selenium Sulfide (SELSUN BLUE EX) Apply 1 application topically daily. Shampoo once a day  . senna-docusate (SENOKOT-S) 8.6-50 MG tablet Take 1 tablet by mouth at bedtime.  . simvastatin (ZOCOR) 10 MG tablet Take 10 mg by mouth at bedtime.  . Sunscreens (COPPERTONE KIDS SPF15 EX) Apply 1 application topically daily as needed (prevent sunburn).  . tamsulosin (FLOMAX) 0.4 MG CAPS capsule Take 0.4 mg by mouth daily.  William [name] tolnaftate (TINACTIN) 1 % powder Apply 1 application topically daily as needed for itching.  . tolnaftate (TINACTIN) 1 % spray Apply 1 spray topically daily as needed  (itching).  . traMADol (ULTRAM) 50 MG tablet Take 50 mg by mouth 3 (three) times daily as needed for moderate pain or severe pain.  . Valproic Acid (DEPAKENE) 250 MG/5ML SYRP syrup Take 1,250 mg by mouth at bedtime.   . Valproic Acid (DEPAKENE) 250 MG/5ML SYRP syrup Take 750 mg by mouth every morning.   . Wheat Dextrin (BENEFIBER PO) Take 15 mLs by mouth daily as needed (constipation).     Allergies:   Bee venom, Nutritional supplements, Latex, Lithium, Penicillins cross reactors, and Sulfa drugs cross reactors   Social History   Socioeconomic History  . Marital status: Single    Spouse name: Not on file  . Number of children: Not on file  . Years of education: Not on file  . Highest education level: Not on file  Occupational History  . Occupation: disabled  Tobacco Use  . Smoking status: Never Smoker  . Smokeless tobacco: Never Used  Substance and Sexual Activity  . Alcohol use: No  . Drug use: No  . Sexual activity: Not on file  Other Topics Concern  . Not on file  Social History Narrative   Single, disabled, lives in home with full time caregiver- [name]   Right handed   caffeine use - none   Social Determinants of Health   Financial Resource Strain:   . Difficulty of Paying Living Expenses: Not on file  Food Insecurity:   . Worried About Programme researcher, broadcasting/film/video in the Last Year: Not on file  . Ran Out of Food in the Last Year: Not on file  Transportation Needs:   . Lack of Transportation (Medical): Not on file  . Lack of Transportation (Non-Medical): Not on file  Physical Activity:   . Days of Exercise per Week: Not on file  . Minutes of Exercise per Session: Not on file  Stress:   . Feeling of Stress : Not on file  Social Connections:   . Frequency of Communication with Friends and Family: Not on file  . Frequency of Social Gatherings with Friends and Family: Not on file  . Attends Religious Services: Not on file  . Active Member of Clubs or Organizations: Not on  file  . Attends Banker Meetings: Not on file  . Marital Status: Not on file     Family History: The patient's family history includes Dementia in his mother; Diabetes in his father and mother; Heart disease in his father and mother. ROS:  Please see the history of present illness.    All other systems reviewed and are negative.  EKGs/Labs/Other Studies Reviewed:    The following studies were reviewed today:  EKG:  EKG ordered today and personally reviewed.  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 versus lead placement  Recent Labs: November 2020 renal function normal  Physical Exam:    VS:  BP 110/86 (BP Location: Right Arm, Patient Position: Sitting, Cuff Size: Normal)   Pulse 91   Ht 6' (1.829 m)   Wt 174 lb (78.9 kg)   SpO2 100%   BMI 23.60 kg/m     Wt Readings from Last 3 Encounters:  09/28/19 174 lb (78.9 kg)  03/25/17 149 lb 12.8 oz (67.9 kg)  03/10/17 157 lb (71.2 kg)     GEN: Marked tremor well nourished, well developed in no acute distress HEENT: Normal NECK: No JVD; No carotid bruits LYMPHATICS: No lymphadenopathy CARDIAC: RRR, no murmurs, rubs, gallops RESPIRATORY:  Clear to auscultation without rales, wheezing or rhonchi  ABDOMEN: Soft, non-tender, non-distended MUSCULOSKELETAL:  No edema; No deformity  SKIN: Warm and dry NEUROLOGIC:  Alert and oriented x 3 PSYCHIATRIC:  Normal affect    Signed, Norman Herrlich, MD  09/28/2019 3:19 PM    [facility]

## 2019-09-28 ENCOUNTER — Other Ambulatory Visit: Payer: Self-pay

## 2019-09-28 ENCOUNTER — Ambulatory Visit (INDEPENDENT_AMBULATORY_CARE_PROVIDER_SITE_OTHER): Payer: Medicare Other | Admitting: Cardiology

## 2019-09-28 ENCOUNTER — Encounter: Payer: Self-pay | Admitting: Cardiology

## 2019-09-28 VITALS — BP 110/86 | HR 91 | Ht 72.0 in | Wt 174.0 lb

## 2019-09-28 DIAGNOSIS — I351 Nonrheumatic aortic (valve) insufficiency: Secondary | ICD-10-CM | POA: Diagnosis not present

## 2019-09-28 DIAGNOSIS — I119 Hypertensive heart disease without heart failure: Secondary | ICD-10-CM

## 2019-09-28 DIAGNOSIS — I480 Paroxysmal atrial fibrillation: Secondary | ICD-10-CM

## 2019-09-28 DIAGNOSIS — I7789 Other specified disorders of arteries and arterioles: Secondary | ICD-10-CM | POA: Diagnosis not present

## 2019-09-28 NOTE — Patient Instructions (Signed)
Medication Instructions:  Your physician recommends that you continue on your current medications as directed. Please refer to the Current Medication list given to you today.  *If you need a refill on your cardiac medications before your next appointment, please call your pharmacy*  Lab Work: None ordered   If you have labs (blood work) drawn today and your tests are completely normal, you will receive your results only by: . MyChart Message (if you have MyChart) OR . A paper copy in the mail If you have any lab test that is abnormal or we need to change your treatment, we will call you to review the results.  Testing/Procedures: None ordered   Follow-Up: At [facility], you and your health needs are our priority.  As part of our continuing mission to provide you with exceptional heart care, we have created designated Provider Care Teams.  These Care Teams include your primary Cardiologist (physician) and Advanced Practice Providers (APPs -  Physician Assistants and Nurse Practitioners) who all work together to provide you with the care you need, when you need it.  Your next appointment:   12 month(s)  The format for your next appointment:   In Person  Provider:   Brian Munley, MD  Other Instructions None   

## 2019-10-15 ENCOUNTER — Other Ambulatory Visit: Payer: Self-pay | Admitting: Family Medicine

## 2019-10-15 MED ORDER — CIPROFLOXACIN HCL 250 MG PO TABS: 250.0000 mg | ORAL_TABLET | Freq: Two times a day (BID) | ORAL | 0 refills | Status: DC

## 2019-10-18 ENCOUNTER — Other Ambulatory Visit: Payer: Self-pay | Admitting: Legal Medicine

## 2019-10-18 DIAGNOSIS — E782 Mixed hyperlipidemia: Secondary | ICD-10-CM

## 2019-10-18 DIAGNOSIS — F2089 Other schizophrenia: Secondary | ICD-10-CM

## 2019-10-18 DIAGNOSIS — I10 Essential (primary) hypertension: Secondary | ICD-10-CM

## 2019-10-18 DIAGNOSIS — G2119 Other drug induced secondary parkinsonism: Secondary | ICD-10-CM | POA: Diagnosis not present

## 2019-10-20 ENCOUNTER — Encounter: Payer: Self-pay | Admitting: Family Medicine

## 2019-10-20 ENCOUNTER — Encounter: Payer: Self-pay | Admitting: Legal Medicine

## 2019-10-27 ENCOUNTER — Other Ambulatory Visit: Payer: Self-pay

## 2019-10-27 MED ORDER — MEDROXYPROGESTERONE ACETATE 150 MG/ML IM SUSP: 150.0000 mg | INTRAMUSCULAR | 11 refills | Status: AC

## 2019-12-09 ENCOUNTER — Telehealth: Payer: Self-pay | Admitting: Legal Medicine

## 2019-12-09 NOTE — Progress Notes (Signed)
°  Chronic Care Management   Note  12/09/2019 Name: JEANETTE MOFFATT MRN: [id number] DOB: [birth year]  [name] is a 59 y.o. year old male who is a primary care patient of Abigail Miyamoto, MD. I reached out to [employer] by phone today in response to a referral sent by Mr. Verlon Setting Schaper's PCP, Abigail Miyamoto, MD.   Mr. Laraia was given information about Chronic Care Management services today including:  1. CCM service includes personalized support from designated clinical staff supervised by his physician, including individualized plan of care and coordination with other care providers 2. 24/7 contact phone numbers for assistance for urgent and routine care needs. 3. Service will only be billed when office clinical staff spend 20 minutes or more in a month to coordinate care. 4. Only one practitioner may furnish and bill the service in a calendar month. 5. The patient may stop CCM services at any time (effective at the end of the month) by phone call to the office staff.   Patient agreed to services and verbal consent obtained.   Follow up plan:   Lynnae January Upstream Scheduler

## 2019-12-17 ENCOUNTER — Other Ambulatory Visit: Payer: Self-pay

## 2019-12-17 DIAGNOSIS — I119 Hypertensive heart disease without heart failure: Secondary | ICD-10-CM

## 2019-12-17 DIAGNOSIS — F319 Bipolar disorder, unspecified: Secondary | ICD-10-CM

## 2019-12-17 NOTE — Chronic Care Management (AMB) (Deleted)
Chronic Care Management Pharmacy  Name: William [name]  MRN: [id number] DOB: 31-Jul-1961   Chief Complaint/ HPI  William [name],  59 y.o. , male presents for their Initial CCM visit with the clinical pharmacist via telephone due to COVID-19 Pandemic.  PCP : William Miyamoto, MD  Their chronic conditions include: Hypertensive heart disease, secondary parkinsonism, BPH, Anemia, Bipolar/Schizophrenia.  Office Visits:  08/31/2019 - no meds ordered. VPA therapeutic, mild anemia, kidney/liver ok; good cholesterol and PSA normal.   06/29/2019 - cellulitis of left toe. Keflex ordered.   Consult Visit:  09/28/2019 - Dr. Dulce [name] visit. Stable no recurrent afib. No anticoagulation needed at this time. Continue low-dose aspirin. Stable BP without treatment.   Medications: Outpatient Encounter Medications as of 12/20/2019  Medication Sig  . acetaminophen (TYLENOL) 325 MG tablet Take 650 mg by mouth every 6 (six) hours as needed for moderate pain.  Marland Kitchen alendronate (FOSAMAX) 70 MG tablet Take 70 mg by mouth once a week.   Marland Kitchen aspirin EC 81 MG tablet Take 81 mg by mouth daily.  Marland Kitchen azelastine (ASTELIN) 0.1 % nasal spray Place 1 spray into both nostrils 2 (two) times daily as needed for rhinitis or allergies. Use in each nostril as directed  . Benzocaine-Menthol (CEPACOL SORE THROAT) 15-3.6 MG LOZG Use as directed 1 lozenge in the mouth or throat every 2 (two) hours as needed (sore throat).  . bismuth subsalicylate (BISMATROL) 262 MG/15ML suspension Take 30 mLs by mouth every 6 (six) hours as needed.  . Calamine 8-8 % LOTN Apply 1 application topically as needed (rash).  . Calcium Carb-Cholecalciferol (CALCIUM-VITAMIN D) 600-400 MG-UNIT TABS Take 2 tablets by mouth daily.  . carbidopa-levodopa (SINEMET IR) 25-100 MG per tablet Take 1 tablet by mouth 3 (three) times daily.   . ciprofloxacin (CIPRO) 250 MG tablet Take 1 tablet (250 mg total) by mouth 2 (two) times daily.  . clonazePAM (KLONOPIN)  0.5 MG tablet Take 1 tablet (0.5 mg total) by mouth 4 (four) times daily as needed for anxiety (agitation, sleep).  . Dentifrices (SENSODYNE DT) Place onto teeth. Brush teeth twice a day.  . diphenhydrAMINE (BENADRYL) spray Apply 1 application topically every 6 (six) hours as needed for itching.  Marland Kitchen ENSURE PLUS (ENSURE PLUS) LIQD Take 237 mLs by mouth 2 (two) times daily between meals.  Marland Kitchen EPINEPHrine (EPI-PEN) 0.3 mg/0.3 mL DEVI Inject 0.3 mg into the muscle once. For allergic reaction to bees  . fexofenadine (ALLEGRA) 180 MG tablet Take 180 mg by mouth daily as needed for allergies or rhinitis.  . Glycerin (OASIS MOISTURIZING MOUTH SPRAY) 35 % LIQD Use as directed 2 sprays in the mouth or throat daily as needed (dry mouth).  . guaifenesin (ROBAFEN) 100 MG/5ML syrup Take 200 mg by mouth 3 (three) times daily as needed for cough.  . hydrochlorothiazide (HYDRODIURIL) 25 MG tablet TAKE 1 TABLET BY MOUTH TWICE A DAY  . hydrocortisone 1 % lotion Apply 1 application topically 4 (four) times daily as needed for itching.  . hydrocortisone 1 % ointment Apply 1 application topically 4 (four) times daily as needed for itching.  . hydrocortisone cream 1 % Apply 1 application topically 4 (four) times daily as needed for itching.  Marland Kitchen ibuprofen (ADVIL,MOTRIN) 200 MG tablet Take 400 mg by mouth every 8 (eight) hours as needed for moderate pain.  . magnesium hydroxide (MILK OF MAGNESIA) 400 MG/5ML suspension Take 30 mLs by mouth daily as needed for mild constipation.  . medroxyPROGESTERone (DEPO-PROVERA) 150  MG/ML injection Inject 1 mL (150 mg total) into the muscle once a week. On fridays  . Miconazole Nitrate (NEOSPORIN AF EX) Apply 1 application topically 3 (three) times daily as needed (minor cuts).  . neomycin-bacitracin-polymyxin (NEOSPORIN) [phone] ointment Apply 1 application topically 3 (three) times daily as needed (minor cuts).  . OLANZapine zydis (ZYPREXA) 5 MG disintegrating tablet Take 5 mg by mouth  at bedtime.  Marland Kitchen OLANZapine zydis (ZYPREXA) 5 MG disintegrating tablet Take 5 mg by mouth 2 (two) times daily as needed (agitation).  Marland Kitchen omeprazole (PRILOSEC) 20 MG capsule Take 20 mg by mouth 2 (two) times daily.  . phenazopyridine (URISTAT) 95 MG tablet Take 95 mg by mouth 3 (three) times daily as needed for pain.  . phenol (CHLORASEPTIC) 1.4 % LIQD Use as directed 1 spray in the mouth or throat every 2 (two) hours as needed for throat irritation / pain.  . polyethylene glycol (MIRALAX / GLYCOLAX) packet Take 17 g by mouth daily.  . potassium chloride (K-DUR) 10 MEQ tablet Take 1 tablet (10 mEq total) by mouth daily.  . povidone-iodine (BETADINE) 10 % external solution Apply 1 application topically as needed for wound care.  . pseudoephedrine (SUDAFED) 30 MG tablet Take 30-60 mg by mouth every 4 (four) hours as needed for congestion.  . ranitidine (ZANTAC) 150 MG tablet Take 150 mg by mouth daily.   . Selenium Sulfide (SELSUN BLUE EX) Apply 1 application topically daily. Shampoo once a day  . senna-docusate (SENOKOT-S) 8.6-50 MG tablet Take 1 tablet by mouth at bedtime.  . simvastatin (ZOCOR) 10 MG tablet Take 10 mg by mouth at bedtime.  . Sunscreens (COPPERTONE KIDS SPF15 EX) Apply 1 application topically daily as needed (prevent sunburn).  . tamsulosin (FLOMAX) 0.4 MG CAPS capsule Take 0.4 mg by mouth daily.  Marland Kitchen tolnaftate (TINACTIN) 1 % powder Apply 1 application topically daily as needed for itching.  . tolnaftate (TINACTIN) 1 % spray Apply 1 spray topically daily as needed (itching).  . traMADol (ULTRAM) 50 MG tablet Take 50 mg by mouth 3 (three) times daily as needed for moderate pain or severe pain.  . Valproic Acid (DEPAKENE) 250 MG/5ML SYRP syrup Take 1,250 mg by mouth at bedtime.   . Valproic Acid (DEPAKENE) 250 MG/5ML SYRP syrup Take 750 mg by mouth every morning.   . Wheat Dextrin (BENEFIBER PO) Take 15 mLs by mouth daily as needed (constipation).   No facility-administered encounter  medications on file as of 12/20/2019.     Current Diagnosis/Assessment:  Goals Addressed   None     Hyperlipidemia   Cholesterol panel good per Dr. Lamar Sprinkles last visit.  Patient has failed these meds in past: none noted Patient is currently {CHL Controlled/Uncontrolled:[phone]} on the following medications: simvastatin 10 mg  We discussed:  {CHL HP Upstream Pharmacy discussion:[phone]}  Plan  Continue {CHL HP Upstream Pharmacy Plans:[phone]}   Hypertension   BP today is:  {CHL HP UPSTREAM Pharmacist BP [phone]}  Office blood pressures are  BP Readings from Last 3 Encounters:  09/28/19 110/86  03/25/17 114/68  03/10/17 107/62    Patient has failed these meds in the past: metoprolol succinate 50  Patient is currently controlled/uncontrolled*** on the following medications: hctz, k-dur 10 meq daily Patient checks BP at home {CHL HP BP Monitoring [phone]}  Patient home BP readings are ranging: ***  We discussed {CHL HP Upstream Pharmacy discussion:[phone]}  Plan  Continue {CHL HP Upstream Pharmacy ZOXWR:[id number]}   Schizophrenia/agitation/Bipolar 1   Patient  has failed these meds in past: mirtazapine 7.5 mg, risperdal oral solution, lorazepam, lithium carbonate Patient is currently {CHL Controlled/Uncontrolled:[phone]} on the following medications: clonazepam 0.5 mg qid prn, zyprexa 5 mg, Valproic acid syrup  We discussed:  {CHL HP Upstream Pharmacy [phone]}  Plan  Continue {CHL HP Upstream Pharmacy Plans:[id number]}  BPH   Patient has failed these meds in past: alfuzosin Patient is currently {CHL Controlled/Uncontrolled:[phone]} on the following medications: tamsulosin  We discussed:  {CHL HP Upstream Pharmacy [phone]}  Plan  Continue {CHL HP Upstream Pharmacy Plans:[id number]}  Secondary Parkinsonism   Patient has failed these meds in past: artane Patient is currently {CHL  Controlled/Uncontrolled:[phone]} on the following medications: sinemet 25/100 tid  We discussed:  {CHL HP Upstream Pharmacy [phone]}  Plan  Continue {CHL HP Upstream Pharmacy Plans:[id number]}   ***GERD/Constipation   Patient has failed these meds in past: *** Patient is currently {CHL Controlled/Uncontrolled:[phone]} on the following medications: omeprazole 20 mg bid, miralax daily, ranitidine, miralax daily, senna-docusate daily, benefiber prn  We discussed:  {CHL HP Upstream Pharmacy [phone]}  Plan  Continue {CHL HP Upstream Pharmacy Plans:[id number]}   Osteoporosis   Patient has failed these meds in past: *** Patient is currently {CHL Controlled/Uncontrolled:[phone]} on the following medications: fosamax, calcium with d bid.  We discussed:  {CHL HP Upstream Pharmacy [phone]}  Plan  Continue {CHL HP Upstream Pharmacy Plans:[id number]}  Health Maintenance   Patient is currently {CHL Controlled/Uncontrolled:[phone]} on the following medications:   Fexofenadine and azelastine prn- allergies/rhinitis Tylenol/ibuprofen - moderate pain Benzocaine-menthol/chloraseptic spray - sore throat Peptobismol - prn stomach upset Calamine lotion - prn rash Tinactin topical/Benadryl spray/hydrocortisone topical - prn itching Ensure plus - nutrition Epi-pen - prn allergic reation to bees Glycerin mouth spray - dry mouth Guafenesin - prn cough Milk of mag -prn constipation Medroxyprogestereone weekly - hypersexuality Neosporin/neosporin AF - minor cuts Phenazopyridine 95 mg - tid prn pain Povidone-iodine - wound care Sudafed 30 mg - 1-2 tabs po q4h prn congestion Tramadol - moderate-sever pain  We discussed:  ***  Plan  Continue {CHL HP Upstream Pharmacy MOQHU:[id number]}  Vaccines   Reviewed and discussed patient's vaccination history.  COVID Vaccine?***   There is no immunization history on file for this  patient.  Plan  Recommended patient receive *** vaccine in *** office/pharmacy.   Medication Management   Pt uses [facility] for all medications His group home administers his medications.  Facility endorses *** compliance  We discussed: ***  Plan  ***     Follow up: ***

## 2019-12-17 NOTE — Progress Notes (Signed)
Appt has been scheduled for 12/20/2019 with Sara Beth Brown, PharmD. 

## 2019-12-20 ENCOUNTER — Telehealth: Payer: Medicare Other

## 2019-12-20 DIAGNOSIS — I10 Essential (primary) hypertension: Secondary | ICD-10-CM

## 2019-12-20 DIAGNOSIS — F2089 Other schizophrenia: Secondary | ICD-10-CM

## 2019-12-20 DIAGNOSIS — E782 Mixed hyperlipidemia: Secondary | ICD-10-CM

## 2019-12-20 DIAGNOSIS — G2119 Other drug induced secondary parkinsonism: Secondary | ICD-10-CM

## 2020-01-30 ENCOUNTER — Encounter: Payer: Self-pay | Admitting: Legal Medicine

## 2020-01-30 DIAGNOSIS — Z9189 Other specified personal risk factors, not elsewhere classified: Secondary | ICD-10-CM | POA: Insufficient documentation

## 2020-01-30 DIAGNOSIS — N3944 Nocturnal enuresis: Secondary | ICD-10-CM | POA: Insufficient documentation

## 2020-01-30 DIAGNOSIS — J9611 Chronic respiratory failure with hypoxia: Secondary | ICD-10-CM | POA: Insufficient documentation

## 2020-01-30 DIAGNOSIS — N251 Nephrogenic diabetes insipidus: Secondary | ICD-10-CM | POA: Insufficient documentation

## 2020-01-30 DIAGNOSIS — Z79899 Other long term (current) drug therapy: Secondary | ICD-10-CM | POA: Insufficient documentation

## 2020-01-30 DIAGNOSIS — F2089 Other schizophrenia: Secondary | ICD-10-CM | POA: Insufficient documentation

## 2020-01-31 ENCOUNTER — Other Ambulatory Visit: Payer: Self-pay

## 2020-01-31 ENCOUNTER — Ambulatory Visit (INDEPENDENT_AMBULATORY_CARE_PROVIDER_SITE_OTHER): Payer: Medicare Other | Admitting: Legal Medicine

## 2020-01-31 ENCOUNTER — Encounter: Payer: Self-pay | Admitting: Legal Medicine

## 2020-01-31 VITALS — BP 110/64 | HR 110 | Temp 98.0°F | Resp 18 | Ht 67.32 in | Wt 179.0 lb

## 2020-01-31 DIAGNOSIS — J9611 Chronic respiratory failure with hypoxia: Secondary | ICD-10-CM

## 2020-01-31 DIAGNOSIS — Z6827 Body mass index (BMI) 27.0-27.9, adult: Secondary | ICD-10-CM

## 2020-01-31 DIAGNOSIS — N251 Nephrogenic diabetes insipidus: Secondary | ICD-10-CM | POA: Diagnosis not present

## 2020-01-31 DIAGNOSIS — N3944 Nocturnal enuresis: Secondary | ICD-10-CM

## 2020-01-31 DIAGNOSIS — E782 Mixed hyperlipidemia: Secondary | ICD-10-CM

## 2020-01-31 DIAGNOSIS — F2089 Other schizophrenia: Secondary | ICD-10-CM

## 2020-01-31 DIAGNOSIS — I10 Essential (primary) hypertension: Secondary | ICD-10-CM | POA: Insufficient documentation

## 2020-01-31 DIAGNOSIS — G219 Secondary parkinsonism, unspecified: Secondary | ICD-10-CM

## 2020-01-31 DIAGNOSIS — Z79899 Other long term (current) drug therapy: Secondary | ICD-10-CM

## 2020-01-31 NOTE — Assessment & Plan Note (Signed)
Patient has not had any respiratory problems since last hospitalization.  He is no longer on oxygen.

## 2020-01-31 NOTE — Progress Notes (Signed)
p 

## 2020-01-31 NOTE — Assessment & Plan Note (Signed)
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  He is fairly well controlled with sinemet.  He sees neurologist

## 2020-01-31 NOTE — Assessment & Plan Note (Signed)
Patient has a lifelong history of schizophrenia with some bipolar attributes.  He has been taken care of by psychiatrist and he is in group home for care.

## 2020-01-31 NOTE — Assessment & Plan Note (Signed)
Patient has BPH and doing well.  No urinary or fecal incontinence.

## 2020-01-31 NOTE — Assessment & Plan Note (Signed)

## 2020-01-31 NOTE — Progress Notes (Signed)
Established Patient Office Visit  Subjective:  Patient ID: William [name], male    DOB: 09/04/1961  Age: 59 y.o. MRN: [id number]  CC:  Chief Complaint  Patient presents with  . Schizophrenia    HPI William [name] presents for Chronic visit Patient has chronic schizophrenia on depakote, clonazepam, zyprexia.  He has been seeing psychiatrist.  Patient presents for follow up of hypertension.  Patient tolerating HCTZ well with side effects.  Patient was diagnosed with hypertension 2010 so has been treated for hypertension for 10 years.Patient is working on maintaining diet and exercise regimen and follows up as directed. Complication include none.  Patient presents with hyperlipidemia.  Compliance with treatment has been good; patient takes medicines as directed, maintains low cholesterol diet, follows up as directed, and maintains exercise regimen.  Patient is using simvastatin without problems.  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  He is on sinemet and doing well. Past Medical History:  Diagnosis Date  . Asthma   . BPH (benign prostatic hyperplasia)   . Chronic bronchitis (HCC)   . Crohn's colitis (HCC)   . GERD (gastroesophageal reflux disease)   . Insomnia   . Mental retardation   . Osteoporosis   . Prostate hypertrophy   . PTSD (post-traumatic stress disorder)     Past Surgical History:  Procedure Laterality Date  . TONSILLECTOMY AND ADENOIDECTOMY     as child  . TOOTH EXTRACTION  10/22/2011   Procedure: DENTAL RESTORATION/EXTRACTIONS;  Surgeon: Georgia LopesScott M Jensen, DDS;  [facility];  Service: Oral Surgery;  Laterality: Bilateral;  . TRANSURETHRAL RESECTION OF PROSTATE     x 2    Family History  Problem Relation Age of Onset  . Diabetes Father   . Heart disease Father   . Diabetes Mother   . Heart disease Mother   . Dementia Mother     Social History   Socioeconomic History  . Marital status: Single    Spouse name: Not on file  . Number of children:  Not on file  . Years of education: Not on file  . Highest education level: Not on file  Occupational History  . Occupation: disabled  Tobacco Use  . Smoking status: Never Smoker  . Smokeless tobacco: Never Used  Substance and Sexual Activity  . Alcohol use: No  . Drug use: No  . Sexual activity: Not Currently  Other Topics Concern  . Not on file  Social History Narrative   Single, disabled, lives in home with full time caregiver- [name]   Right handed   caffeine use - none   Social Determinants of Health   Financial Resource Strain:   . Difficulty of Paying Living Expenses:   Food Insecurity:   . Worried About Programme researcher, broadcasting/film/videounning Out of Food in the Last Year:   . Baristaan Out of Food in the Last Year:   Transportation Needs:   . Freight forwarderLack of Transportation (Medical):   Marland Kitchen. Lack of Transportation (Non-Medical):   Physical Activity:   . Days of Exercise per Week:   . Minutes of Exercise per Session:   Stress:   . Feeling of Stress :   Social Connections:   . Frequency of Communication with Friends and Family:   . Frequency of Social Gatherings with Friends and Family:   . Attends Religious Services:   . Active Member of Clubs or Organizations:   . Attends BankerClub or Organization Meetings:   Marland Kitchen. Marital Status:   Intimate Partner Violence:   .  Fear of Current or Ex-Partner:   . Emotionally Abused:   Marland Kitchen Physically Abused:   . Sexually Abused:     Outpatient Medications Prior to Visit  Medication Sig Dispense Refill  . acetaminophen (TYLENOL) 325 MG tablet Take 650 mg by mouth every 6 (six) hours as needed for moderate pain.    Marland Kitchen alendronate (FOSAMAX) 70 MG tablet Take 70 mg by mouth once a week.     Marland Kitchen aspirin EC 81 MG tablet Take 81 mg by mouth daily.    Marland Kitchen azelastine (ASTELIN) 0.1 % nasal spray Place 1 spray into both nostrils 2 (two) times daily as needed for rhinitis or allergies. Use in each nostril as directed    . Benzocaine-Menthol (CEPACOL SORE THROAT) 15-3.6 MG LOZG Use as directed 1  lozenge in the mouth or throat every 2 (two) hours as needed (sore throat).    . bismuth subsalicylate (BISMATROL) 262 MG/15ML suspension Take 30 mLs by mouth every 6 (six) hours as needed.    . busPIRone (BUSPAR) 10 MG tablet     . Calcium Carb-Cholecalciferol (CALCIUM-VITAMIN D) 600-400 MG-UNIT TABS Take 2 tablets by mouth daily.    . carbidopa-levodopa (SINEMET IR) 25-100 MG per tablet Take 1 tablet by mouth 3 (three) times daily.     . clonazePAM (KLONOPIN) 0.5 MG tablet Take 1 tablet (0.5 mg total) by mouth 4 (four) times daily as needed for anxiety (agitation, sleep). 5 tablet 0  . Dentifrices (SENSODYNE DT) Place onto teeth. Brush teeth twice a day.    . ENSURE PLUS (ENSURE PLUS) LIQD Take 237 mLs by mouth 2 (two) times daily between meals.    Marland Kitchen EPINEPHrine (EPI-PEN) 0.3 mg/0.3 mL DEVI Inject 0.3 mg into the muscle once. For allergic reaction to bees    . fexofenadine (ALLEGRA) 180 MG tablet Take 180 mg by mouth daily as needed for allergies or rhinitis.    Marland Kitchen guaifenesin (ROBAFEN) 100 MG/5ML syrup Take 200 mg by mouth 3 (three) times daily as needed for cough.    . hydrochlorothiazide (HYDRODIURIL) 25 MG tablet TAKE 1 TABLET BY MOUTH TWICE A DAY 56 tablet 10  . ibuprofen (ADVIL,MOTRIN) 200 MG tablet Take 400 mg by mouth every 8 (eight) hours as needed for moderate pain.    . INGREZZA 80 MG CAPS Take 1 capsule by mouth daily.    . magnesium hydroxide (MILK OF MAGNESIA) 400 MG/5ML suspension Take 30 mLs by mouth daily as needed for mild constipation.    . medroxyPROGESTERone (DEPO-PROVERA) 150 MG/ML injection Inject 1 mL (150 mg total) into the muscle once a week. On fridays 1 mL 11  . OLANZapine zydis (ZYPREXA) 5 MG disintegrating tablet Take 5 mg by mouth 2 (two) times daily as needed (agitation).    Marland Kitchen omeprazole (PRILOSEC) 40 MG capsule Take 40 mg by mouth daily.    . polyethylene glycol (MIRALAX / GLYCOLAX) packet Take 17 g by mouth daily. 14 each 0  . potassium chloride (KLOR-CON) 10 MEQ  tablet Take 10 mEq by mouth daily.    . pseudoephedrine (SUDAFED) 30 MG tablet Take 30-60 mg by mouth every 4 (four) hours as needed for congestion.    . Selenium Sulfide (SELSUN BLUE EX) Apply 1 application topically daily. Shampoo once a day    . senna-docusate (SENOKOT-S) 8.6-50 MG tablet Take 1 tablet by mouth at bedtime. 30 tablet 0  . simvastatin (ZOCOR) 10 MG tablet Take 10 mg by mouth at bedtime.    . tamsulosin (FLOMAX)  0.4 MG CAPS capsule Take 0.4 mg by mouth daily.    . Valproic Acid (DEPAKENE) 250 MG/5ML SYRP syrup Take 1,250 mg by mouth at bedtime.     . Valproic Acid (DEPAKENE) 250 MG/5ML SYRP syrup Take 750 mg by mouth every morning.     . Wheat Dextrin (BENEFIBER PO) Take 15 mLs by mouth daily as needed (constipation).    . Calamine 8-8 % LOTN Apply 1 application topically as needed (rash).    . diphenhydrAMINE (BENADRYL) spray Apply 1 application topically every 6 (six) hours as needed for itching.    . Glycerin (OASIS MOISTURIZING MOUTH SPRAY) 35 % LIQD Use as directed 2 sprays in the mouth or throat daily as needed (dry mouth).    . hydrocortisone 1 % lotion Apply 1 application topically 4 (four) times daily as needed for itching.    . hydrocortisone 1 % ointment Apply 1 application topically 4 (four) times daily as needed for itching.    . hydrocortisone cream 1 % Apply 1 application topically 4 (four) times daily as needed for itching.    . Miconazole Nitrate (NEOSPORIN AF EX) Apply 1 application topically 3 (three) times daily as needed (minor cuts).    . neomycin-bacitracin-polymyxin (NEOSPORIN) [phone] ointment Apply 1 application topically 3 (three) times daily as needed (minor cuts).    . phenazopyridine (URISTAT) 95 MG tablet Take 95 mg by mouth 3 (three) times daily as needed for pain.    . potassium chloride (K-DUR) 10 MEQ tablet Take 1 tablet (10 mEq total) by mouth daily. 30 tablet 0  . povidone-iodine (BETADINE) 10 % external solution Apply 1 application  topically as needed for wound care.    . ciprofloxacin (CIPRO) 250 MG tablet Take 1 tablet (250 mg total) by mouth 2 (two) times daily. 14 tablet 0  . OLANZapine zydis (ZYPREXA) 5 MG disintegrating tablet Take 5 mg by mouth at bedtime.    Marland Kitchen omeprazole (PRILOSEC) 20 MG capsule Take 20 mg by mouth 2 (two) times daily.    . phenol (CHLORASEPTIC) 1.4 % LIQD Use as directed 1 spray in the mouth or throat every 2 (two) hours as needed for throat irritation / pain.    . ranitidine (ZANTAC) 150 MG tablet Take 150 mg by mouth daily.     . Sunscreens (COPPERTONE KIDS SPF15 EX) Apply 1 application topically daily as needed (prevent sunburn).    . tolnaftate (TINACTIN) 1 % powder Apply 1 application topically daily as needed for itching.    . tolnaftate (TINACTIN) 1 % spray Apply 1 spray topically daily as needed (itching).    . traMADol (ULTRAM) 50 MG tablet Take 50 mg by mouth 3 (three) times daily as needed for moderate pain or severe pain.    Marland Kitchen valproic acid (DEPAKENE) 250 MG/5ML solution      No facility-administered medications prior to visit.    Allergies  Allergen Reactions  . Bee Venom Anaphylaxis  . Nutritional Supplements Anaphylaxis    Care giver unsure if this is correct  . Latex Hives  . Lithium Other (See Comments)    DM  . Penicillins Cross Reactors Hives and Rash  . Sulfa Drugs Cross Reactors Hives and Rash    ROS Review of Systems  Constitutional: Negative.   HENT: Negative.   Eyes: Negative.   Respiratory: Negative.   Cardiovascular: Negative.   Gastrointestinal: Negative.   Musculoskeletal: Negative.   Skin: Negative.   Neurological: Negative.   Hematological: Negative.  Objective:    Physical Exam  Constitutional: He appears well-developed and well-nourished.  HENT:  Head: Normocephalic and atraumatic.  Right Ear: External ear normal.  Left Ear: External ear normal.  Nose: Nose normal.  Mouth/Throat: Oropharynx is clear and moist.  Eyes: Pupils are  equal, round, and reactive to light. Conjunctivae and EOM are normal.  Cardiovascular: Normal rate, regular rhythm, normal heart sounds and intact distal pulses.  Pulmonary/Chest: Effort normal and breath sounds normal.  Abdominal: Soft. Bowel sounds are normal.  Musculoskeletal:     Cervical back: Normal range of motion.     Comments: Muscle wasting, cogwheel rigidity  Neurological: He is alert. He has normal reflexes.  cogwheel rigidity, pill rolling tremor, slurred speech, trouble starting and stopping walking  Vitals reviewed.   BP 110/64 (BP Location: Right Arm, Patient Position: Sitting)   Pulse (!) 110   Temp 98 F (36.7 C) (Temporal)   Resp 18   Ht 5' 7.32" (1.71 m)   Wt 179 lb (81.2 kg)   SpO2 97%   BMI 27.77 kg/m  Wt Readings from Last 3 Encounters:  01/31/20 179 lb (81.2 kg)  09/28/19 174 lb (78.9 kg)  03/25/17 149 lb 12.8 oz (67.9 kg)     Health Maintenance Due  Topic Date Due  . Hepatitis C Screening  Never done  . TETANUS/TDAP  Never done  . COLONOSCOPY  Never done    There are no preventive care reminders to display for this patient.  Lab Results  Component Value Date   TSH 3.874 03/06/2017   Lab Results  Component Value Date   WBC 8.1 03/10/2017   HGB 8.9 (L) 03/10/2017   HCT 27.8 (L) 03/10/2017   MCV 106.9 (H) 03/10/2017   PLT 169 03/10/2017   Lab Results  Component Value Date   NA 142 03/10/2017   K 3.5 03/10/2017   CO2 26 03/10/2017   GLUCOSE 96 03/10/2017   BUN 16 03/10/2017   CREATININE 0.94 03/10/2017   BILITOT 0.6 03/05/2017   ALKPHOS 79 03/05/2017   AST 35 03/05/2017   ALT 39 03/05/2017   PROT 5.8 (L) 03/05/2017   ALBUMIN 2.9 (L) 03/05/2017   CALCIUM 7.9 (L) 03/10/2017   ANIONGAP 7 03/10/2017   No results found for: CHOL No results found for: HDL No results found for: LDLCALC No results found for: TRIG No results found for: CHOLHDL Lab Results  Component Value Date   HGBA1C 4.5 (L) 03/08/2017      Assessment & Plan:    Problem List Items Addressed This Visit      Cardiovascular and Mediastinum   Essential hypertension, benign    An individual hypertension care plan was established and reinforced today.  The patient's status was assessed using clinical findings on exam and labs or diagnostic tests. The patient's success at meeting treatment goals on disease specific evidence-based guidelines and found to be well controlled. SELF MANAGEMENT: The patient and I together assessed ways to personally work towards obtaining the recommended goals. RECOMMENDATIONS: avoid decongestants found in common cold remedies, decrease consumption of alcohol, perform routine monitoring of BP with home BP cuff, exercise, reduction of dietary salt, take medicines as prescribed, try not to miss doses and quit smoking.  Regular exercise and maintaining a healthy weight is needed.  Stress reduction may help. A CLINICAL SUMMARY including written plan identify barriers to care unique to individual due to social or financial issues.  We attempt to mutually creat solutions for individual and  family understanding.      Relevant Orders   CBC with Differential/Platelet   Comprehensive metabolic panel     Respiratory   Chronic hypoxemic respiratory failure [facility])    Patient has not had any respiratory problems since last hospitalization.  He is no longer on oxygen.        Endocrine   Nephrogenic diabetes insipidus (HCC)    Patient has long history of DI and is controlled with HCTZ.  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  He is fairly well controlled with sinemet.  He sees neurologist        Other   Nocturnal enuresis    Patient has BPH and doing well.  No urinary or fecal incontinence.      Simple schizophrenia, subchronic condition (HCC)    Patient has a lifelong history of schizophrenia with some  bipolar attributes.  He has been taken care of by psychiatrist and he is in group home for care.      Relevant Orders   Valproic acid level   High risk medication use    Patient continues to be on neuroleptics for his schizophrenia.      Mixed hyperlipidemia    AN INDIVIDUAL CARE PLAN for hyperlipidemia/ cholesterol was established and reinforced today.  The patient's status was assessed using clinical findings on exam, lab and other diagnostic tests. The patient's disease status was assessed based on evidence-based guidelines and found to be well controlled. MEDICATIONS were reviewed. SELF MANAGEMENT GOALS have been discussed and patient's success at attaining the goal of low cholesterol was assessed. RECOMMENDATION given include regular exercise 3 days a week and low cholesterol/low fat diet. CLINICAL SUMMARY including written plan to identify barriers unique to the patient due to social or economic  reasons was discussed.      Relevant Orders   Lipid panel   BMI 27.0-27.9,adult    BMI is stable for a group home patient.  Weight loss is not recommended.         No orders of the defined types were placed in this encounter.   Follow-up: Return in about 3 months (around 05/02/2020) for fasting.    Brent Bulla, MD

## 2020-01-31 NOTE — Assessment & Plan Note (Signed)
BMI is stable for a group home patient.  Weight loss is not recommended.

## 2020-01-31 NOTE — Assessment & Plan Note (Signed)
Patient has long history of DI and is controlled with HCTZ.  We follow electrolytes

## 2020-01-31 NOTE — Assessment & Plan Note (Signed)

## 2020-01-31 NOTE — Assessment & Plan Note (Signed)
Patient continues to be on neuroleptics for his schizophrenia.

## 2020-02-01 LAB — CBC WITH DIFFERENTIAL/PLATELET
Basophils Absolute: 0.1 10*3/uL (ref 0.0–0.2)
Basos: 1 %
EOS (ABSOLUTE): 0.1 10*3/uL (ref 0.0–0.4)
Eos: 1 %
Hematocrit: 37.6 % (ref 37.5–51.0)
Hemoglobin: 13 g/dL (ref 13.0–17.7)
Immature Grans (Abs): 0 10*3/uL (ref 0.0–0.1)
Immature Granulocytes: 0 %
Lymphocytes Absolute: 1.4 10*3/uL (ref 0.7–3.1)
Lymphs: 19 %
MCH: 31.8 pg (ref 26.6–33.0)
MCHC: 34.6 g/dL (ref 31.5–35.7)
MCV: 92 fL (ref 79–97)
Monocytes Absolute: 0.6 10*3/uL (ref 0.1–0.9)
Monocytes: 8 %
Neutrophils Absolute: 5.6 10*3/uL (ref 1.4–7.0)
Neutrophils: 71 %
Platelets: 249 10*3/uL (ref 150–450)
RBC: 4.09 x10E6/uL — ABNORMAL LOW (ref 4.14–5.80)
RDW: 13.3 % (ref 11.6–15.4)
WBC: 7.8 10*3/uL (ref 3.4–10.8)

## 2020-02-01 LAB — COMPREHENSIVE METABOLIC PANEL
ALT: 17 IU/L (ref 0–44)
AST: 19 IU/L (ref 0–40)
Albumin/Globulin Ratio: 1.8 (ref 1.2–2.2)
Albumin: 4.4 g/dL (ref 3.8–4.9)
Alkaline Phosphatase: 53 IU/L (ref 48–121)
BUN/Creatinine Ratio: 23 — ABNORMAL HIGH (ref 9–20)
BUN: 30 mg/dL — ABNORMAL HIGH (ref 6–24)
Bilirubin Total: 0.3 mg/dL (ref 0.0–1.2)
CO2: 22 mmol/L (ref 20–29)
Calcium: 9.5 mg/dL (ref 8.7–10.2)
Chloride: 100 mmol/L (ref 96–106)
Creatinine, Ser: 1.31 mg/dL — ABNORMAL HIGH (ref 0.76–1.27)
GFR calc Af Amer: 69 mL/min/{1.73_m2} (ref >59)
GFR calc non Af Amer: 60 mL/min/{1.73_m2} (ref >59)
Globulin, Total: 2.4 g/dL (ref 1.5–4.5)
Glucose: 92 mg/dL (ref 65–99)
Potassium: 3.9 mmol/L (ref 3.5–5.2)
Sodium: 138 mmol/L (ref 134–144)
Total Protein: 6.8 g/dL (ref 6.0–8.5)

## 2020-02-01 LAB — VALPROIC ACID LEVEL: Valproic Acid Lvl: 73 ug/mL (ref 50–100)

## 2020-02-01 LAB — LIPID PANEL
Chol/HDL Ratio: 3.2 ratio (ref 0.0–5.0)
Cholesterol, Total: 159 mg/dL (ref 100–199)
HDL: 50 mg/dL (ref >39)
LDL Chol Calc (NIH): 90 mg/dL (ref 0–99)
Triglycerides: 106 mg/dL (ref 0–149)
VLDL Cholesterol Cal: 19 mg/dL (ref 5–40)

## 2020-02-01 LAB — CARDIOVASCULAR RISK ASSESSMENT

## 2020-02-01 NOTE — Progress Notes (Signed)
No anemia, kidney tests stable, liver tests normal, cholesterol normal, valproic acid 73 in therapeutic range lp

## 2020-02-02 DIAGNOSIS — M2041 Other hammer toe(s) (acquired), right foot: Secondary | ICD-10-CM | POA: Insufficient documentation

## 2020-02-02 DIAGNOSIS — L84 Corns and callosities: Secondary | ICD-10-CM | POA: Insufficient documentation

## 2020-02-02 DIAGNOSIS — M2042 Other hammer toe(s) (acquired), left foot: Secondary | ICD-10-CM | POA: Insufficient documentation

## 2020-02-04 ENCOUNTER — Telehealth: Payer: Self-pay

## 2020-02-04 ENCOUNTER — Other Ambulatory Visit: Payer: Self-pay | Admitting: Legal Medicine

## 2020-02-04 MED ORDER — CIPROFLOXACIN HCL 250 MG PO TABS
250.0000 mg | ORAL_TABLET | Freq: Two times a day (BID) | ORAL | 0 refills | Status: DC
Start: 2020-02-04 — End: 2020-05-04

## 2020-02-04 NOTE — Telephone Encounter (Signed)
Patient is complaining of testicular pain again, is asking for antibiotic.

## 2020-05-04 ENCOUNTER — Ambulatory Visit (INDEPENDENT_AMBULATORY_CARE_PROVIDER_SITE_OTHER): Payer: Medicare Other | Admitting: Legal Medicine

## 2020-05-04 ENCOUNTER — Other Ambulatory Visit: Payer: Self-pay

## 2020-05-04 ENCOUNTER — Encounter: Payer: Self-pay | Admitting: Legal Medicine

## 2020-05-04 VITALS — BP 140/84 | HR 95 | Temp 97.8°F | Resp 16 | Ht 67.32 in | Wt 185.0 lb

## 2020-05-04 DIAGNOSIS — F2089 Other schizophrenia: Secondary | ICD-10-CM

## 2020-05-04 DIAGNOSIS — N3944 Nocturnal enuresis: Secondary | ICD-10-CM

## 2020-05-04 DIAGNOSIS — I10 Essential (primary) hypertension: Secondary | ICD-10-CM | POA: Diagnosis not present

## 2020-05-04 DIAGNOSIS — E782 Mixed hyperlipidemia: Secondary | ICD-10-CM

## 2020-05-04 DIAGNOSIS — J9611 Chronic respiratory failure with hypoxia: Secondary | ICD-10-CM

## 2020-05-04 DIAGNOSIS — I119 Hypertensive heart disease without heart failure: Secondary | ICD-10-CM

## 2020-05-04 DIAGNOSIS — G219 Secondary parkinsonism, unspecified: Secondary | ICD-10-CM

## 2020-05-04 NOTE — Progress Notes (Signed)
Subjective:  Patient ID: William [name], male    DOB: [birth year]  Age: 59 y.o. MRN: [id number]  Chief Complaint  Patient presents with  . Hypertension  . Hyperlipidemia    HPI: chronic visit  Patient presents for follow up of hypertension.  Patient tolerating HCTZ well with side effects.  Patient was diagnosed with hypertension 2010 so has been treated for hypertension for 10 years.Patient is working on maintaining diet and exercise regimen and follows up as directed. Complication include SIADH.  Patient presents with hyperlipidemia.  Compliance with treatment has been good; patient takes medicines as directed, maintains low cholesterol diet, follows up as directed, and maintains exercise regimen.  Patient is using simvastatin without problems.  2nd Parkinsons on sinemetIR 25/100 from [facility]   Current Outpatient Medications on File Prior to Visit  Medication Sig Dispense Refill  . acetaminophen (TYLENOL) 325 MG tablet Take 650 mg by mouth every 6 (six) hours as needed for moderate pain.    Marland Kitchen alendronate (FOSAMAX) 70 MG tablet Take 70 mg by mouth once a week.     Marland Kitchen aspirin EC 81 MG tablet Take 81 mg by mouth daily.    Marland Kitchen azelastine (ASTELIN) 0.1 % nasal spray Place 1 spray into both nostrils 2 (two) times daily as needed for rhinitis or allergies. Use in each nostril as directed    . Benzocaine-Menthol (CEPACOL SORE THROAT) 15-3.6 MG LOZG Use as directed 1 lozenge in the mouth or throat every 2 (two) hours as needed (sore throat).    . bismuth subsalicylate (BISMATROL) 262 MG/15ML suspension Take 30 mLs by mouth every 6 (six) hours as needed.    . busPIRone (BUSPAR) 10 MG tablet     . Calcium Carb-Cholecalciferol (CALCIUM-VITAMIN D) 600-400 MG-UNIT TABS Take 2 tablets by mouth daily.    . carbidopa-levodopa (SINEMET IR) 25-100 MG per tablet Take 1 tablet by mouth 3 (three) times daily.     . clonazePAM (KLONOPIN) 0.5 MG tablet Take 1 tablet (0.5 mg total) by mouth 4 (four)  times daily as needed for anxiety (agitation, sleep). 5 tablet 0  . Dentifrices (SENSODYNE DT) Place onto teeth. Brush teeth twice a day.    . ENSURE PLUS (ENSURE PLUS) LIQD Take 237 mLs by mouth 2 (two) times daily between meals.    Marland Kitchen EPINEPHrine (EPI-PEN) 0.3 mg/0.3 mL DEVI Inject 0.3 mg into the muscle once. For allergic reaction to bees    . fexofenadine (ALLEGRA) 180 MG tablet Take 180 mg by mouth daily as needed for allergies or rhinitis.    Marland Kitchen guaifenesin (ROBAFEN) 100 MG/5ML syrup Take 200 mg by mouth 3 (three) times daily as needed for cough.    . hydrochlorothiazide (HYDRODIURIL) 25 MG tablet TAKE 1 TABLET BY MOUTH TWICE A DAY 56 tablet 10  . ibuprofen (ADVIL,MOTRIN) 200 MG tablet Take 400 mg by mouth every 8 (eight) hours as needed for moderate pain.    . INGREZZA 80 MG CAPS Take 1 capsule by mouth daily.    . magnesium hydroxide (MILK OF MAGNESIA) 400 MG/5ML suspension Take 30 mLs by mouth daily as needed for mild constipation.    . medroxyPROGESTERone (DEPO-PROVERA) 150 MG/ML injection Inject 1 mL (150 mg total) into the muscle once a week. On fridays 1 mL 11  . OLANZapine zydis (ZYPREXA) 5 MG disintegrating tablet Take 5 mg by mouth 2 (two) times daily as needed (agitation).    Marland Kitchen omeprazole (PRILOSEC) 40 MG capsule Take 40 mg by mouth daily.    Marland Kitchen  polyethylene glycol (MIRALAX / GLYCOLAX) packet Take 17 g by mouth daily. 14 each 0  . potassium chloride (KLOR-CON) 10 MEQ tablet Take 10 mEq by mouth daily.    . pseudoephedrine (SUDAFED) 30 MG tablet Take 30-60 mg by mouth every 4 (four) hours as needed for congestion.    . Selenium Sulfide (SELSUN BLUE EX) Apply 1 application topically daily. Shampoo once a day    . senna-docusate (SENOKOT-S) 8.6-50 MG tablet Take 1 tablet by mouth at bedtime. 30 tablet 0  . simvastatin (ZOCOR) 10 MG tablet Take 10 mg by mouth at bedtime.    . tamsulosin (FLOMAX) 0.4 MG CAPS capsule Take 0.4 mg by mouth daily.    . Valproic Acid (DEPAKENE) 250 MG/5ML  SYRP syrup Take 1,250 mg by mouth at bedtime.     . Valproic Acid (DEPAKENE) 250 MG/5ML SYRP syrup Take 750 mg by mouth every morning.     . Wheat Dextrin (BENEFIBER PO) Take 15 mLs by mouth daily as needed (constipation).     No current facility-administered medications on file prior to visit.   Past Medical History:  Diagnosis Date  . Asthma   . BPH (benign prostatic hyperplasia)   . Chronic bronchitis (HCC)   . Crohn's colitis (HCC)   . GERD (gastroesophageal reflux disease)   . Insomnia   . Mental retardation   . Osteoporosis   . Prostate hypertrophy   . PTSD (post-traumatic stress disorder)    Past Surgical History:  Procedure Laterality Date  . TONSILLECTOMY AND ADENOIDECTOMY     as child  . TOOTH EXTRACTION  10/22/2011   Procedure: DENTAL RESTORATION/EXTRACTIONS;  Surgeon: Georgia Lopes, DDS;  Location: [facility] OR;  Service: Oral Surgery;  Laterality: Bilateral;  . TRANSURETHRAL RESECTION OF PROSTATE     x 2    Family History  Problem Relation Age of Onset  . Diabetes Father   . Heart disease Father   . Diabetes Mother   . Heart disease Mother   . Dementia Mother    Social History   Socioeconomic History  . Marital status: Single    Spouse name: Not on file  . Number of children: Not on file  . Years of education: Not on file  . Highest education level: Not on file  Occupational History  . Occupation: disabled  Tobacco Use  . Smoking status: Never Smoker  . Smokeless tobacco: Never Used  Vaping Use  . Vaping Use: Never used  Substance and Sexual Activity  . Alcohol use: No  . Drug use: No  . Sexual activity: Not Currently  Other Topics Concern  . Not on file  Social History Narrative   Single, disabled, lives in home with full time caregiver- [name]   Right handed   caffeine use - none   Social Determinants of Health   Financial Resource Strain:   . Difficulty of Paying Living Expenses: Not on file  Food Insecurity:   . Worried About Brewing technologist in the Last Year: Not on file  . Ran Out of Food in the Last Year: Not on file  Transportation Needs:   . Lack of Transportation (Medical): Not on file  . Lack of Transportation (Non-Medical): Not on file  Physical Activity:   . Days of Exercise per Week: Not on file  . Minutes of Exercise per Session: Not on file  Stress:   . Feeling of Stress : Not on file  Social Connections:   . Frequency  of Communication with Friends and Family: Not on file  . Frequency of Social Gatherings with Friends and Family: Not on file  . Attends Religious Services: Not on file  . Active Member of Clubs or Organizations: Not on file  . Attends BankerClub or Organization Meetings: Not on file  . Marital Status: Not on file    Review of Systems  Constitutional: Negative.   HENT: Negative for congestion, hearing loss, postnasal drip and rhinorrhea.   Eyes: Negative.   Cardiovascular: Negative for chest pain.  Gastrointestinal: Negative.   Genitourinary: Negative for difficulty urinating, dysuria and enuresis.  Musculoskeletal: Negative.   Neurological:       Cogwheel ridigity  Psychiatric/Behavioral: Positive for behavioral problems and dysphoric mood. Negative for suicidal ideas.     Objective:  BP 140/84   Pulse 95   Temp 97.8 F (36.6 C)   Resp 16   Ht 5' 7.32" (1.71 m)   Wt 185 lb (83.9 kg)   SpO2 98%   BMI 28.70 kg/m   BP/Weight 05/04/2020 01/31/2020 09/28/2019  Systolic BP 140 110 110  Diastolic BP 84 64 86  Wt. (Lbs) 185 179 174  BMI 28.7 27.77 23.6    Physical Exam Vitals reviewed.  HENT:     Head: Normocephalic and atraumatic.     Right Ear: Tympanic membrane, ear canal and external ear normal.     Left Ear: Tympanic membrane, ear canal and external ear normal.     Nose: Nose normal.     Mouth/Throat:     Mouth: Mucous membranes are moist.     Pharynx: Oropharynx is clear.  Eyes:     Extraocular Movements: Extraocular movements intact.     Conjunctiva/sclera: Conjunctivae  normal.     Pupils: Pupils are equal, round, and reactive to light.  Cardiovascular:     Rate and Rhythm: Normal rate and regular rhythm.     Pulses: Normal pulses.     Heart sounds: Normal heart sounds.  Pulmonary:     Effort: Pulmonary effort is normal.     Breath sounds: Normal breath sounds.  Abdominal:     General: Abdomen is flat. Bowel sounds are normal.     Palpations: Abdomen is soft.  Musculoskeletal:     Cervical back: Rigidity present.     Comments: cogwheel rigidity,, some muscle wasting  Skin:    General: Skin is warm and dry.     Capillary Refill: Capillary refill takes less than 2 seconds.  Neurological:     Mental Status: He is alert.     Comments: cogwheel rigidity and mask like facies, trouble starting or stopping activity.  He has a paucity of movement, grunts responses       Lab Results  Component Value Date   WBC 7.8 01/31/2020   HGB 13.0 01/31/2020   HCT 37.6 01/31/2020   PLT 249 01/31/2020   GLUCOSE 92 01/31/2020   CHOL 159 01/31/2020   TRIG 106 01/31/2020   HDL 50 01/31/2020   LDLCALC 90 01/31/2020   ALT 17 01/31/2020   AST 19 01/31/2020   NA 138 01/31/2020   K 3.9 01/31/2020   CL 100 01/31/2020   CREATININE 1.31 (H) 01/31/2020   BUN 30 (H) 01/31/2020   CO2 22 01/31/2020   TSH 3.874 03/06/2017   INR 1.15 03/06/2017   HGBA1C 4.5 (L) 03/08/2017      Assessment & Plan:   1. Other schizophrenia (HCC) AN INDIVIDUAL CARE PLAN schizophrenia was established and  reinforced today.  The patient's status was assessed using clinical findings on exam, labs, and other diagnostic testing. Patient's success at meeting treatment goals based on disease specific evidence-bassed guidelines and found to be in fair control. RECOMMENDATIONS include maintaining present medicines and treatment. Psychiatrist is managing his medicines  2. Essential hypertension, benign An individual hypertension care plan was established and reinforced today.  The patient's  status was assessed using clinical findings on exam and labs or diagnostic tests. The patient's success at meeting treatment goals on disease specific evidence-based guidelines and found to be well controlled. SELF MANAGEMENT: The patient and I together assessed ways to personally work towards obtaining the recommended goals. RECOMMENDATIONS: avoid decongestants found in common cold remedies, decrease consumption of alcohol, perform routine monitoring of BP with home BP cuff, exercise, reduction of dietary salt, take medicines as prescribed, try not to miss doses and quit smoking.  Regular exercise and maintaining a healthy weight is needed.  Stress reduction may help. A CLINICAL SUMMARY including written plan identify barriers to care unique to individual due to social or financial issues.  We attempt to mutually creat solutions for individual and family understanding.  3. Mixed hyperlipidemia AN INDIVIDUAL CARE PLAN for hyperlipidemia/ cholesterol was established and reinforced today.  The patient's status was assessed using clinical findings on exam, lab and other diagnostic tests. The patient's disease status was assessed based on evidence-based guidelines and found to be well controlled. MEDICATIONS were reviewed. SELF MANAGEMENT GOALS have been discussed and patient's success at attaining the goal of low cholesterol was assessed. RECOMMENDATION given include regular exercise 3 days a week and low cholesterol/low fat diet. CLINICAL SUMMARY including written plan to identify barriers unique to the patient due to social or economic  reasons was discussed. 4. Nocturnal enuresis Patient still has urinary incontinence at night and uses diapers.  5. Chronic hypoxemic respiratory failure (HCC) Patient respiratory status is stable and he uses O2 only at night  6. 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  The patient's  status was assessed using clinical findings on exam and labs or diagnostic tests. The patient's success at meeting treatment goals on disease specific evidence-based guidelines and found to be well controlled. SELF MANAGEMENT: The patient and I together assessed ways to personally work towards obtaining the recommended goals. RECOMMENDATIONS: avoid decongestants found in common cold remedies, decrease consumption of alcohol, perform routine monitoring of BP with home BP cuff, exercise, reduction of dietary salt, take medicines as prescribed, try not to miss doses and quit smoking.  Regular exercise and maintaining a healthy weight is needed.  Stress reduction may help. A CLINICAL SUMMARY including written plan identify barriers to care unique to individual due to social or financial issues.  We attempt to mutually creat solutions for individual and family understanding.  7. Secondary parkinsonism, unspecified secondary Parkinsonism type (HCC) AN INDIVIDUAL CARE PLAN for parkinson was established and reinforced today.  The patient's status was assessed using clinical findings on exam, labs, and other diagnostic testing. Patient's success at meeting treatment goals based on disease specific evidence-bassed guidelines and found to be in good control. RECOMMENDATIONS include maintaining present medicines and treatment. He is seeing neurology         Follow-up: Return in about 6 months (around 11/04/2020) for fasting.  An After Visit Summary was printed and given to the patient.  [facility] [phone]

## 2020-05-05 LAB — CBC WITH DIFFERENTIAL/PLATELET
Basophils Absolute: 0.1 10*3/uL (ref 0.0–0.2)
Basos: 1 %
EOS (ABSOLUTE): 0.2 10*3/uL (ref 0.0–0.4)
Eos: 2 %
Hematocrit: 38.5 % (ref 37.5–51.0)
Hemoglobin: 12.8 g/dL — ABNORMAL LOW (ref 13.0–17.7)
Immature Grans (Abs): 0 10*3/uL (ref 0.0–0.1)
Immature Granulocytes: 0 %
Lymphocytes Absolute: 1.7 10*3/uL (ref 0.7–3.1)
Lymphs: 19 %
MCH: 31.4 pg (ref 26.6–33.0)
MCHC: 33.2 g/dL (ref 31.5–35.7)
MCV: 94 fL (ref 79–97)
Monocytes Absolute: 0.8 10*3/uL (ref 0.1–0.9)
Monocytes: 8 %
Neutrophils Absolute: 6.4 10*3/uL (ref 1.4–7.0)
Neutrophils: 70 %
Platelets: 237 10*3/uL (ref 150–450)
RBC: 4.08 x10E6/uL — ABNORMAL LOW (ref 4.14–5.80)
RDW: 14.4 % (ref 11.6–15.4)
WBC: 9.2 10*3/uL (ref 3.4–10.8)

## 2020-05-05 LAB — COMPREHENSIVE METABOLIC PANEL
ALT: 19 IU/L (ref 0–44)
AST: 19 IU/L (ref 0–40)
Albumin/Globulin Ratio: 2 (ref 1.2–2.2)
Albumin: 4.5 g/dL (ref 3.8–4.9)
Alkaline Phosphatase: 48 IU/L (ref 48–121)
BUN/Creatinine Ratio: 30 — ABNORMAL HIGH (ref 9–20)
BUN: 38 mg/dL — ABNORMAL HIGH (ref 6–24)
Bilirubin Total: 0.4 mg/dL (ref 0.0–1.2)
CO2: 23 mmol/L (ref 20–29)
Calcium: 9 mg/dL (ref 8.7–10.2)
Chloride: 98 mmol/L (ref 96–106)
Creatinine, Ser: 1.27 mg/dL (ref 0.76–1.27)
GFR calc Af Amer: 71 mL/min/{1.73_m2} (ref >59)
GFR calc non Af Amer: 61 mL/min/{1.73_m2} (ref >59)
Globulin, Total: 2.3 g/dL (ref 1.5–4.5)
Glucose: 91 mg/dL (ref 65–99)
Potassium: 4.2 mmol/L (ref 3.5–5.2)
Sodium: 138 mmol/L (ref 134–144)
Total Protein: 6.8 g/dL (ref 6.0–8.5)

## 2020-05-05 LAB — LIPID PANEL
Chol/HDL Ratio: 3.9 ratio (ref 0.0–5.0)
Cholesterol, Total: 160 mg/dL (ref 100–199)
HDL: 41 mg/dL (ref >39)
LDL Chol Calc (NIH): 91 mg/dL (ref 0–99)
Triglycerides: 159 mg/dL — ABNORMAL HIGH (ref 0–149)
VLDL Cholesterol Cal: 28 mg/dL (ref 5–40)

## 2020-05-05 LAB — VALPROIC ACID LEVEL: Valproic Acid Lvl: 65 ug/mL (ref 50–100)

## 2020-05-05 LAB — CARDIOVASCULAR RISK ASSESSMENT

## 2020-05-05 NOTE — Progress Notes (Signed)
Mild anemia- improved , renal tests normal, liver tests normal, triglycerides high- watch diet, valproic acid level 65 therapeutic lp

## 2020-06-13 DIAGNOSIS — G2119 Other drug induced secondary parkinsonism: Secondary | ICD-10-CM | POA: Diagnosis not present

## 2020-06-13 DIAGNOSIS — F2089 Other schizophrenia: Secondary | ICD-10-CM | POA: Diagnosis not present

## 2020-06-13 DIAGNOSIS — E782 Mixed hyperlipidemia: Secondary | ICD-10-CM | POA: Diagnosis not present

## 2020-06-13 DIAGNOSIS — I10 Essential (primary) hypertension: Secondary | ICD-10-CM | POA: Diagnosis not present

## 2020-06-14 ENCOUNTER — Other Ambulatory Visit: Payer: Self-pay | Admitting: Legal Medicine

## 2020-06-15 ENCOUNTER — Other Ambulatory Visit: Payer: Self-pay | Admitting: Legal Medicine

## 2020-06-15 DIAGNOSIS — T782XXS Anaphylactic shock, unspecified, sequela: Secondary | ICD-10-CM

## 2020-06-15 MED ORDER — EPINEPHRINE 0.3 MG/0.3ML IJ SOAJ: 0.3000 mg | INTRAMUSCULAR | 1 refills | Status: AC | PRN

## 2020-07-03 DIAGNOSIS — F319 Bipolar disorder, unspecified: Secondary | ICD-10-CM | POA: Insufficient documentation

## 2020-07-03 DIAGNOSIS — N189 Chronic kidney disease, unspecified: Secondary | ICD-10-CM | POA: Insufficient documentation

## 2020-07-03 DIAGNOSIS — R944 Abnormal results of kidney function studies: Secondary | ICD-10-CM | POA: Insufficient documentation

## 2020-07-03 DIAGNOSIS — E119 Type 2 diabetes mellitus without complications: Secondary | ICD-10-CM | POA: Insufficient documentation

## 2020-08-08 ENCOUNTER — Ambulatory Visit (INDEPENDENT_AMBULATORY_CARE_PROVIDER_SITE_OTHER): Payer: Medicare Other | Admitting: Legal Medicine

## 2020-08-08 ENCOUNTER — Encounter: Payer: Self-pay | Admitting: Legal Medicine

## 2020-08-08 ENCOUNTER — Other Ambulatory Visit: Payer: Self-pay

## 2020-08-08 VITALS — BP 110/58 | HR 67 | Temp 96.7°F | Resp 17 | Ht 67.0 in | Wt 168.4 lb

## 2020-08-08 DIAGNOSIS — E782 Mixed hyperlipidemia: Secondary | ICD-10-CM | POA: Diagnosis not present

## 2020-08-08 DIAGNOSIS — F2089 Other schizophrenia: Secondary | ICD-10-CM

## 2020-08-08 DIAGNOSIS — N400 Enlarged prostate without lower urinary tract symptoms: Secondary | ICD-10-CM

## 2020-08-08 DIAGNOSIS — I10 Essential (primary) hypertension: Secondary | ICD-10-CM | POA: Diagnosis not present

## 2020-08-08 DIAGNOSIS — N251 Nephrogenic diabetes insipidus: Secondary | ICD-10-CM

## 2020-08-08 DIAGNOSIS — G219 Secondary parkinsonism, unspecified: Secondary | ICD-10-CM | POA: Diagnosis not present

## 2020-08-08 NOTE — Progress Notes (Signed)
Subjective:  Patient ID: [name], male    DOB: Aug 28, 1961  Age: 59 y.o. MRN: [id number]  Chief Complaint  Patient presents with  . Hyperlipidemia  . Hypertension    HPI: chronic visit  Patient presents with hyperlipidemia.  Compliance with treatment has been good; patient takes medicines as directed, maintains low cholesterol diet, follows up as directed, and maintains exercise regimen.  Patient is using diet without problems.  Patient presents for follow up of hypertension.  Patient tolerating HCTZ well with side effects.  Patient was diagnosed with hypertension 2010 so has been treated for hypertension for 10 years.Patient is working on maintaining diet and exercise regimen and follows up as directed. Complication include none  Patient has drug inducted parkinsonism.Marland KitchenHe is on sinemet25/100. He is seeing neurology.   Current Outpatient Medications on File Prior to Visit  Medication Sig Dispense Refill  . acetaminophen (TYLENOL) 325 MG tablet Take 650 mg by mouth every 6 (six) hours as needed for moderate pain.    Marland Kitchen albuterol (PROVENTIL) (2.5 MG/3ML) 0.083% nebulizer solution 1 vial by Other route 4 (four) times a day    . alendronate (FOSAMAX) 70 MG tablet Take 70 mg by mouth once a week.     Marland Kitchen aspirin 81 MG EC tablet Take by mouth.    Marland Kitchen aspirin EC 81 MG tablet Take 81 mg by mouth daily.    Marland Kitchen azelastine (ASTELIN) 0.1 % nasal spray Place 1 spray into both nostrils 2 (two) times daily as needed for rhinitis or allergies. Use in each nostril as directed    . Benzocaine-Menthol (CEPACOL SORE THROAT) 15-3.6 MG LOZG Use as directed 1 lozenge in the mouth or throat every 2 (two) hours as needed (sore throat).    . bismuth subsalicylate (BISMATROL) 262 MG/15ML suspension Take 30 mLs by mouth every 6 (six) hours as needed.    . busPIRone (BUSPAR) 10 MG tablet     . Calcium Carb-Cholecalciferol (CALCIUM-VITAMIN D) 600-400 MG-UNIT TABS Take 2 tablets by mouth daily.    .  carbidopa-levodopa (SINEMET IR) 25-100 MG per tablet Take 1 tablet by mouth 3 (three) times daily.     . clonazePAM (KLONOPIN) 0.5 MG tablet Take 1 tablet (0.5 mg total) by mouth 4 (four) times daily as needed for anxiety (agitation, sleep). 5 tablet 0  . Dentifrices (SENSODYNE DT) Place onto teeth. Brush teeth twice a day.    . ENSURE PLUS (ENSURE PLUS) LIQD Take 237 mLs by mouth 2 (two) times daily between meals.    Marland Kitchen EPINEPHrine 0.3 mg/0.3 mL IJ SOAJ injection Inject 0.3 mg into the muscle as needed for anaphylaxis. 2 each 1  . ferrous sulfate 325 (65 FE) MG tablet Take by mouth.    . fexofenadine (ALLEGRA) 180 MG tablet Take 180 mg by mouth daily as needed for allergies or rhinitis.    Marland Kitchen guaifenesin (ROBAFEN) 100 MG/5ML syrup Take 200 mg by mouth 3 (three) times daily as needed for cough.    . hydrochlorothiazide (HYDRODIURIL) 25 MG tablet TAKE 1 TABLET BY MOUTH TWICE A DAY 56 tablet 10  . ibuprofen (ADVIL,MOTRIN) 200 MG tablet Take 400 mg by mouth every 8 (eight) hours as needed for moderate pain.    . INGREZZA 80 MG CAPS Take 1 capsule by mouth daily.    . magnesium hydroxide (MILK OF MAGNESIA) 400 MG/5ML suspension Take 30 mLs by mouth daily as needed for mild constipation.    . medroxyPROGESTERone (DEPO-PROVERA) 150 MG/ML injection Inject 1  mL (150 mg total) into the muscle once a week. On fridays 1 mL 11  . OLANZapine zydis (ZYPREXA) 5 MG disintegrating tablet Take 5 mg by mouth 2 (two) times daily as needed (agitation).    Marland Kitchen. omeprazole (PRILOSEC) 40 MG capsule Take 40 mg by mouth daily.    . phenazopyridine (PYRIDIUM) 95 MG tablet Take by mouth.    . polyethylene glycol (MIRALAX / GLYCOLAX) packet Take 17 g by mouth daily. 14 each 0  . potassium chloride (KLOR-CON) 10 MEQ tablet Take 10 mEq by mouth daily.    . potassium chloride (KLOR-CON) 10 MEQ tablet Take by mouth.    . pseudoephedrine (SUDAFED) 30 MG tablet Take 30-60 mg by mouth every 4 (four) hours as needed for congestion.    .  Selenium Sulfide (SELSUN BLUE EX) Apply 1 application topically daily. Shampoo once a day    . senna-docusate (SENOKOT-S) 8.6-50 MG tablet Take 1 tablet by mouth at bedtime. 30 tablet 0  . simvastatin (ZOCOR) 10 MG tablet Take 10 mg by mouth at bedtime.    . tamsulosin (FLOMAX) 0.4 MG CAPS capsule Take 0.4 mg by mouth daily.    Marland Kitchen. valproic acid (DEPAKENE) 250 MG/5ML solution Take by mouth.    . Valproic Acid (DEPAKENE) 250 MG/5ML SYRP syrup Take 1,250 mg by mouth at bedtime.     . Valproic Acid (DEPAKENE) 250 MG/5ML SYRP syrup Take 750 mg by mouth every morning.     . Wheat Dextrin (BENEFIBER PO) Take 15 mLs by mouth daily as needed (constipation).     No current facility-administered medications on file prior to visit.   Past Medical History:  Diagnosis Date  . BPH (benign prostatic hyperplasia)   . Insomnia   . Osteoporosis   . Prostate hypertrophy   . PTSD (post-traumatic stress disorder)    Past Surgical History:  Procedure Laterality Date  . TONSILLECTOMY AND ADENOIDECTOMY     as child  . TOOTH EXTRACTION  10/22/2011   Procedure: DENTAL RESTORATION/EXTRACTIONS;  Surgeon: Georgia LopesScott M Jensen, DDS;  Location: [facility] OR;  Service: Oral Surgery;  Laterality: Bilateral;  . TRANSURETHRAL RESECTION OF PROSTATE     x 2    Family History  Problem Relation Age of Onset  . Diabetes Father   . Heart disease Father   . Diabetes Mother   . Heart disease Mother   . Dementia Mother    Social History   Socioeconomic History  . Marital status: Single    Spouse name: Not on file  . Number of children: Not on file  . Years of education: Not on file  . Highest education level: Not on file  Occupational History  . Occupation: disabled  Tobacco Use  . Smoking status: Never Smoker  . Smokeless tobacco: Never Used  Vaping Use  . Vaping Use: Never used  Substance and Sexual Activity  . Alcohol use: No  . Drug use: No  . Sexual activity: Not Currently  Other Topics Concern  . Not on file   Social History Narrative   Single, disabled, lives in home with full time caregiver- [name]   Right handed   caffeine use - none   Social Determinants of Health   Financial Resource Strain:   . Difficulty of Paying Living Expenses: Not on file  Food Insecurity:   . Worried About Programme researcher, broadcasting/film/videounning Out of Food in the Last Year: Not on file  . Ran Out of Food in the Last Year: Not on file  Transportation Needs:   . Freight forwarder (Medical): Not on file  . Lack of Transportation (Non-Medical): Not on file  Physical Activity:   . Days of Exercise per Week: Not on file  . Minutes of Exercise per Session: Not on file  Stress:   . Feeling of Stress : Not on file  Social Connections:   . Frequency of Communication with Friends and Family: Not on file  . Frequency of Social Gatherings with Friends and Family: Not on file  . Attends Religious Services: Not on file  . Active Member of Clubs or Organizations: Not on file  . Attends Banker Meetings: Not on file  . Marital Status: Not on file    Review of Systems  Constitutional: Negative for activity change, appetite change, fatigue and fever.  HENT: Positive for sinus pain. Negative for congestion and postnasal drip.   Eyes: Negative for visual disturbance.  Respiratory: Negative for cough, choking and shortness of breath.   Cardiovascular: Negative for chest pain, palpitations and leg swelling.  Gastrointestinal: Negative.   Genitourinary: Negative for difficulty urinating.  Musculoskeletal: Positive for arthralgias (feet).  Neurological:       Cogwheel ridigity  Psychiatric/Behavioral: Positive for behavioral problems.     Objective:  BP (!) 110/58 (BP Location: Left Arm, Patient Position: Sitting, Cuff Size: Normal)   Pulse 67   Temp (!) 96.7 F (35.9 C) (Temporal)   Resp 17   Ht 5\' 7"  (1.702 m)   Wt 168 lb 6.4 oz (76.4 kg)   SpO2 97%   BMI 26.38 kg/m   BP/Weight 08/08/2020 05/04/2020 01/31/2020  Systolic BP  110 02/02/2020 110  Diastolic BP 58 84 64  Wt. (Lbs) 168.4 185 179  BMI 26.38 28.7 27.77    Physical Exam Vitals reviewed.  Constitutional:      Appearance: Normal appearance. He is normal weight.  HENT:     Head: Normocephalic.     Right Ear: Tympanic membrane normal.     Left Ear: Tympanic membrane normal.     Mouth/Throat:     Mouth: Mucous membranes are moist.     Pharynx: Oropharynx is clear.  Eyes:     Extraocular Movements: Extraocular movements intact.     Conjunctiva/sclera: Conjunctivae normal.     Pupils: Pupils are equal, round, and reactive to light.  Cardiovascular:     Rate and Rhythm: Normal rate and regular rhythm.     Pulses: Normal pulses.     Heart sounds: Normal heart sounds.  Pulmonary:     Effort: Pulmonary effort is normal.     Breath sounds: Normal breath sounds.  Abdominal:     General: Abdomen is flat. Bowel sounds are normal.  Musculoskeletal:     Cervical back: Normal range of motion and neck supple.     Comments: Cogwheel rigidity , mask like facies  Skin:    General: Skin is warm.     Capillary Refill: Capillary refill takes less than 2 seconds.  Neurological:     Mental Status: He is alert. Mental status is at baseline.     Comments: Parkinson tremor, shuffling gait       Lab Results  Component Value Date   WBC 9.2 05/04/2020   HGB 12.8 (L) 05/04/2020   HCT 38.5 05/04/2020   PLT 237 05/04/2020   GLUCOSE 91 05/04/2020   CHOL 160 05/04/2020   TRIG 159 (H) 05/04/2020   HDL 41 05/04/2020   LDLCALC 91 05/04/2020  ALT 19 05/04/2020   AST 19 05/04/2020   NA 138 05/04/2020   K 4.2 05/04/2020   CL 98 05/04/2020   CREATININE 1.27 05/04/2020   BUN 38 (H) 05/04/2020   CO2 23 05/04/2020   TSH 3.874 03/06/2017   INR 1.15 03/06/2017   HGBA1C 4.5 (L) 03/08/2017      Assessment & Plan:  Diagnoses and all orders for this visit: Other schizophrenia (HCC) -     Valproic acid level Patient has chronic undifferentiated schizophrenia all  his life and has been treated at mental health.  He is on multiple medications for this and has acting out spells.  He has been losing weight recently and we discussed I discussed this with his caretaker apparently his Parkinson's needs to be reevaluated by the neurologist since there is further bleeding problems.  Patient continues to have behavioral problems at the home despite therapy.  Essential hypertension, benign -     CBC with Differential/Platelet -     Comprehensive metabolic panel An individual hypertension care plan was established and reinforced today.  The patient's status was assessed using clinical findings on exam and labs or diagnostic tests. The patient's success at meeting treatment goals on disease specific evidence-based guidelines and found to be fair controlled. SELF MANAGEMENT: The patient and I together assessed ways to personally work towards obtaining the recommended goals. RECOMMENDATIONS: avoid decongestants found in common cold remedies, decrease consumption of alcohol, perform routine monitoring of BP with home BP cuff, exercise, reduction of dietary salt, take medicines as prescribed, try not to miss doses and quit smoking.  Regular exercise and maintaining a healthy weight is needed.  Stress reduction may help. A CLINICAL SUMMARY including written plan identify barriers to care unique to individual due to social or financial issues.  We attempt to mutually creat solutions for individual and family understanding.  Mixed hyperlipidemia -     Lipid panel AN INDIVIDUAL CARE PLAN for hyperlipidemia/ cholesterol was established and reinforced today.  The patient's status was assessed using clinical findings on her Parkinson's with last results., lab and other diagnostic tests. The patient's disease status was assessed based on evidence-based guidelines and found to be well controlled. MEDICATIONS were reviewed. SELF MANAGEMENT GOALS have been discussed and patient's success  at attaining the goal of low cholesterol was assessed. RECOMMENDATION given include regular exercise 3 days a week and low cholesterol/low fat diet. CLINICAL SUMMARY including written plan to identify barriers unique to the patient due to social or economic  reasons was discussed.  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  He continues on his Sinemet.  I will refer him back to his neurologist we may need to change his diet.  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  Benign prostatic hyperplasia without lower urinary tract symptoms Patient does have BPH and having very little lower urinary tract symptoms.        I spent 30 minutes dedicated to the care of this patient on the date of this encounter to include face-to-face time with the patient, as well as reviewing old records and comparing his or concerns exam.  I discussed his behavioral problems with his caregiver which apparently have been getting worse with the holidays but this is also true of the other  clients in the home.  He has continued all his medicines but is having increased trouble swallowing which we discussed.  Again we will refer him to him back to his neurologist.  Forms were completed residential home.  Follow-up: Return in about 3 months (around 11/06/2020) for fasting.  An After Visit Summary was printed and given to the patient.  Brent Bulla, MD [facility] [phone]

## 2020-08-09 LAB — CBC WITH DIFFERENTIAL/PLATELET
Basophils Absolute: 0.1 10*3/uL (ref 0.0–0.2)
Basos: 1 %
EOS (ABSOLUTE): 0.1 10*3/uL (ref 0.0–0.4)
Eos: 1 %
Hematocrit: 39.4 % (ref 37.5–51.0)
Hemoglobin: 13.4 g/dL (ref 13.0–17.7)
Immature Grans (Abs): 0 10*3/uL (ref 0.0–0.1)
Immature Granulocytes: 0 %
Lymphocytes Absolute: 2 10*3/uL (ref 0.7–3.1)
Lymphs: 20 %
MCH: 31.4 pg (ref 26.6–33.0)
MCHC: 34 g/dL (ref 31.5–35.7)
MCV: 92 fL (ref 79–97)
Monocytes Absolute: 1 10*3/uL — ABNORMAL HIGH (ref 0.1–0.9)
Monocytes: 10 %
Neutrophils Absolute: 6.9 10*3/uL (ref 1.4–7.0)
Neutrophils: 68 %
Platelets: 223 10*3/uL (ref 150–450)
RBC: 4.27 x10E6/uL (ref 4.14–5.80)
RDW: 13.4 % (ref 11.6–15.4)
WBC: 10 10*3/uL (ref 3.4–10.8)

## 2020-08-09 LAB — VALPROIC ACID LEVEL: Valproic Acid Lvl: 70 ug/mL (ref 50–100)

## 2020-08-09 LAB — COMPREHENSIVE METABOLIC PANEL
ALT: 16 IU/L (ref 0–44)
AST: 21 IU/L (ref 0–40)
Albumin/Globulin Ratio: 1.8 (ref 1.2–2.2)
Albumin: 4.6 g/dL (ref 3.8–4.9)
Alkaline Phosphatase: 53 IU/L (ref 44–121)
BUN/Creatinine Ratio: 20 (ref 9–20)
BUN: 29 mg/dL — ABNORMAL HIGH (ref 6–24)
Bilirubin Total: 0.3 mg/dL (ref 0.0–1.2)
CO2: 24 mmol/L (ref 20–29)
Calcium: 9.1 mg/dL (ref 8.7–10.2)
Chloride: 99 mmol/L (ref 96–106)
Creatinine, Ser: 1.43 mg/dL — ABNORMAL HIGH (ref 0.76–1.27)
GFR calc Af Amer: 62 mL/min/{1.73_m2} (ref >59)
GFR calc non Af Amer: 53 mL/min/{1.73_m2} — ABNORMAL LOW (ref >59)
Globulin, Total: 2.5 g/dL (ref 1.5–4.5)
Glucose: 97 mg/dL (ref 65–99)
Potassium: 4 mmol/L (ref 3.5–5.2)
Sodium: 140 mmol/L (ref 134–144)
Total Protein: 7.1 g/dL (ref 6.0–8.5)

## 2020-08-09 LAB — LIPID PANEL
Chol/HDL Ratio: 3.1 ratio (ref 0.0–5.0)
Cholesterol, Total: 163 mg/dL (ref 100–199)
HDL: 52 mg/dL (ref >39)
LDL Chol Calc (NIH): 92 mg/dL (ref 0–99)
Triglycerides: 107 mg/dL (ref 0–149)
VLDL Cholesterol Cal: 19 mg/dL (ref 5–40)

## 2020-08-09 LAB — CARDIOVASCULAR RISK ASSESSMENT

## 2020-08-09 NOTE — Progress Notes (Signed)
Mild anemia, kidney tests now stage 3, encourage fluids, liver tests OK, Cholesterol normal, valproic acid 70 normal range lp

## 2020-08-14 NOTE — Progress Notes (Signed)
Call home about these results lp

## 2020-08-21 DIAGNOSIS — G2119 Other drug induced secondary parkinsonism: Secondary | ICD-10-CM | POA: Diagnosis not present

## 2020-08-21 DIAGNOSIS — F2089 Other schizophrenia: Secondary | ICD-10-CM | POA: Diagnosis not present

## 2020-08-21 DIAGNOSIS — E782 Mixed hyperlipidemia: Secondary | ICD-10-CM | POA: Diagnosis not present

## 2020-08-21 DIAGNOSIS — I10 Essential (primary) hypertension: Secondary | ICD-10-CM | POA: Diagnosis not present

## 2020-08-30 ENCOUNTER — Encounter: Payer: Self-pay | Admitting: Legal Medicine

## 2020-10-11 DIAGNOSIS — E782 Mixed hyperlipidemia: Secondary | ICD-10-CM | POA: Diagnosis not present

## 2020-10-11 DIAGNOSIS — G2119 Other drug induced secondary parkinsonism: Secondary | ICD-10-CM | POA: Diagnosis not present

## 2020-10-11 DIAGNOSIS — F2089 Other schizophrenia: Secondary | ICD-10-CM | POA: Diagnosis not present

## 2020-11-12 DIAGNOSIS — K219 Gastro-esophageal reflux disease without esophagitis: Secondary | ICD-10-CM | POA: Insufficient documentation

## 2020-11-20 ENCOUNTER — Other Ambulatory Visit: Payer: Self-pay

## 2020-11-20 ENCOUNTER — Ambulatory Visit (INDEPENDENT_AMBULATORY_CARE_PROVIDER_SITE_OTHER): Payer: Medicare Other | Admitting: Legal Medicine

## 2020-11-20 ENCOUNTER — Encounter: Payer: Self-pay | Admitting: Legal Medicine

## 2020-11-20 VITALS — BP 100/70 | HR 84 | Temp 97.3°F | Resp 17 | Ht 67.0 in | Wt 169.0 lb

## 2020-11-20 DIAGNOSIS — G219 Secondary parkinsonism, unspecified: Secondary | ICD-10-CM

## 2020-11-20 DIAGNOSIS — F2089 Other schizophrenia: Secondary | ICD-10-CM | POA: Diagnosis not present

## 2020-11-20 DIAGNOSIS — N400 Enlarged prostate without lower urinary tract symptoms: Secondary | ICD-10-CM

## 2020-11-20 DIAGNOSIS — I10 Essential (primary) hypertension: Secondary | ICD-10-CM | POA: Diagnosis not present

## 2020-11-20 DIAGNOSIS — J9611 Chronic respiratory failure with hypoxia: Secondary | ICD-10-CM

## 2020-11-20 DIAGNOSIS — E782 Mixed hyperlipidemia: Secondary | ICD-10-CM

## 2020-11-20 DIAGNOSIS — I119 Hypertensive heart disease without heart failure: Secondary | ICD-10-CM

## 2020-11-20 DIAGNOSIS — N251 Nephrogenic diabetes insipidus: Secondary | ICD-10-CM

## 2020-11-20 DIAGNOSIS — I480 Paroxysmal atrial fibrillation: Secondary | ICD-10-CM | POA: Insufficient documentation

## 2020-11-20 DIAGNOSIS — I7789 Other specified disorders of arteries and arterioles: Secondary | ICD-10-CM

## 2020-11-20 NOTE — Progress Notes (Signed)
Subjective:  Patient ID: William JuryEdwin A [name], male    DOB: 11/30/1960  Age: 60 y.o. MRN: [id number]  Chief Complaint  Patient presents with  . Hypertension  . Hyperlipidemia  . Schizophrenia    HPI: chronic visit  Patient presents for follow up of hypertension.  Patient tolerating hctz well with side effects.  Patient was diagnosed with hypertension 2010 so has been treated for hypertension for 10 years.Patient is working on maintaining diet and exercise regimen and follows up as directed. Complication include none.  Patient presents with hyperlipidemia.  Compliance with treatment has been good; patient takes medicines as directed, maintains low cholesterol diet, follows up as directed, and maintains exercise regimen.  Patient is using simvastatin without problems.  Schizophrenia controlled with medicines from mental health.   Current Outpatient Medications on File Prior to Visit  Medication Sig Dispense Refill  . acetaminophen (TYLENOL) 325 MG tablet Take 650 mg by mouth every 6 (six) hours as needed for moderate pain.    Marland Kitchen. albuterol (PROVENTIL) (2.5 MG/3ML) 0.083% nebulizer solution 1 vial by Other route 4 (four) times a day    . alendronate (FOSAMAX) 70 MG tablet Take 70 mg by mouth once a week.     Marland Kitchen. aspirin EC 81 MG tablet Take 81 mg by mouth daily.    Marland Kitchen. azelastine (ASTELIN) 0.1 % nasal spray Place 1 spray into both nostrils 2 (two) times daily as needed for rhinitis or allergies. Use in each nostril as directed    . Benzocaine-Menthol 15-3.6 MG LOZG Use as directed 1 lozenge in the mouth or throat every 2 (two) hours as needed (sore throat).    . bismuth subsalicylate (PEPTO BISMOL) 262 MG/15ML suspension Take 30 mLs by mouth every 6 (six) hours as needed.    . busPIRone (BUSPAR) 10 MG tablet     . Calcium Carb-Cholecalciferol (CALCIUM-VITAMIN D) 600-400 MG-UNIT TABS Take 2 tablets by mouth daily.    . carbidopa-levodopa (SINEMET IR) 25-100 MG per tablet Take 1 tablet by mouth 3  (three) times daily.     . clonazePAM (KLONOPIN) 0.5 MG tablet Take 1 tablet (0.5 mg total) by mouth 4 (four) times daily as needed for anxiety (agitation, sleep). 5 tablet 0  . Dentifrices (SENSODYNE DT) Place onto teeth. Brush teeth twice a day.    . ENSURE PLUS (ENSURE PLUS) LIQD Take 237 mLs by mouth 2 (two) times daily between meals.    Marland Kitchen. EPINEPHrine 0.3 mg/0.3 mL IJ SOAJ injection Inject 0.3 mg into the muscle as needed for anaphylaxis. 2 each 1  . escitalopram (LEXAPRO) 5 MG tablet Take 5 mg by mouth daily.    . ferrous sulfate 325 (65 FE) MG tablet Take by mouth.    . fexofenadine (ALLEGRA) 180 MG tablet Take 180 mg by mouth daily as needed for allergies or rhinitis.    Marland Kitchen. guaifenesin (ROBITUSSIN) 100 MG/5ML syrup Take 200 mg by mouth 3 (three) times daily as needed for cough.    . hydrochlorothiazide (HYDRODIURIL) 25 MG tablet TAKE 1 TABLET BY MOUTH TWICE A DAY 56 tablet 10  . ibuprofen (ADVIL,MOTRIN) 200 MG tablet Take 400 mg by mouth every 8 (eight) hours as needed for moderate pain.    . INGREZZA 80 MG CAPS Take 1 capsule by mouth daily.    . magnesium hydroxide (MILK OF MAGNESIA) 400 MG/5ML suspension Take 30 mLs by mouth daily as needed for mild constipation.    . medroxyPROGESTERone (DEPO-PROVERA) 150 MG/ML injection Inject 1 mL (  150 mg total) into the muscle once a week. On fridays 1 mL 11  . OLANZapine zydis (ZYPREXA) 5 MG disintegrating tablet Take 5 mg by mouth 2 (two) times daily as needed (agitation).    Marland Kitchen omeprazole (PRILOSEC) 40 MG capsule Take 40 mg by mouth daily.    . phenazopyridine (PYRIDIUM) 95 MG tablet Take by mouth.    . polyethylene glycol (MIRALAX / GLYCOLAX) packet Take 17 g by mouth daily. 14 each 0  . potassium chloride (KLOR-CON) 10 MEQ tablet Take 10 mEq by mouth daily.    . potassium chloride (KLOR-CON) 10 MEQ tablet Take by mouth.    . pseudoephedrine (SUDAFED) 30 MG tablet Take 30-60 mg by mouth every 4 (four) hours as needed for congestion.    . Selenium  Sulfide (SELSUN BLUE EX) Apply 1 application topically daily. Shampoo once a day    . senna-docusate (SENOKOT-S) 8.6-50 MG tablet Take 1 tablet by mouth at bedtime. 30 tablet 0  . simvastatin (ZOCOR) 10 MG tablet Take 10 mg by mouth at bedtime.    . tamsulosin (FLOMAX) 0.4 MG CAPS capsule Take 0.4 mg by mouth daily.    Marland Kitchen valproic acid (DEPAKENE) 250 MG/5ML solution Take by mouth.    . Valproic Acid (DEPAKENE) 250 MG/5ML SYRP syrup Take 1,250 mg by mouth at bedtime.     . Valproic Acid (DEPAKENE) 250 MG/5ML SYRP syrup Take 750 mg by mouth every morning.     . Wheat Dextrin (BENEFIBER PO) Take 15 mLs by mouth daily as needed (constipation).     No current facility-administered medications on file prior to visit.   Past Medical History:  Diagnosis Date  . BPH (benign prostatic hyperplasia)   . Insomnia   . Osteoporosis   . Prostate hypertrophy   . PTSD (post-traumatic stress disorder)    Past Surgical History:  Procedure Laterality Date  . TONSILLECTOMY AND ADENOIDECTOMY     as child  . TOOTH EXTRACTION  10/22/2011   Procedure: DENTAL RESTORATION/EXTRACTIONS;  Surgeon: Georgia Lopes, DDS;  Location: [facility] OR;  Service: Oral Surgery;  Laterality: Bilateral;  . TRANSURETHRAL RESECTION OF PROSTATE     x 2    Family History  Problem Relation Age of Onset  . Diabetes Father   . Heart disease Father   . Diabetes Mother   . Heart disease Mother   . Dementia Mother    Social History   Socioeconomic History  . Marital status: Single    Spouse name: Not on file  . Number of children: Not on file  . Years of education: Not on file  . Highest education level: Not on file  Occupational History  . Occupation: disabled  Tobacco Use  . Smoking status: Never Smoker  . Smokeless tobacco: Never Used  Vaping Use  . Vaping Use: Never used  Substance and Sexual Activity  . Alcohol use: No  . Drug use: No  . Sexual activity: Not Currently  Other Topics Concern  . Not on file  Social  History Narrative   Single, disabled, lives in home with full time caregiver- [name]   Right handed   caffeine use - none   Social Determinants of Health   Financial Resource Strain: Not on file  Food Insecurity: Not on file  Transportation Needs: Not on file  Physical Activity: Not on file  Stress: Not on file  Social Connections: Not on file    Review of Systems  Constitutional: Negative for activity change  and fatigue.  HENT: Negative for congestion and sinus pain.   Eyes: Negative for visual disturbance.  Respiratory: Negative for chest tightness and shortness of breath.   Gastrointestinal: Negative for abdominal distention and abdominal pain.  Genitourinary: Negative for dysuria and urgency.  Musculoskeletal:       Foot pain from callus  Neurological:       Cogwheel ridigity  Psychiatric/Behavioral: Negative.      Objective:  BP 100/70   Pulse 84   Temp (!) 97.3 F (36.3 C)   Resp 17   Ht  (1.702 m)   Wt 169 lb (76.7 kg)   SpO2 98%   BMI 26.47 kg/m   BP/Weight 11/20/2020 08/08/2020 05/04/2020  Systolic BP 100 110 140  Diastolic BP 70 58 84  Wt. (Lbs) 169 168.4 185  BMI 26.47 26.38 28.7    Physical Exam Vitals reviewed.  Constitutional:      Appearance: Normal appearance.  HENT:     Right Ear: Tympanic membrane normal.     Left Ear: Tympanic membrane normal.     Nose: Nose normal.     Mouth/Throat:     Mouth: Mucous membranes are moist.  Eyes:     Extraocular Movements: Extraocular movements intact.     Conjunctiva/sclera: Conjunctivae normal.     Pupils: Pupils are equal, round, and reactive to light.  Cardiovascular:     Rate and Rhythm: Normal rate and regular rhythm.     Pulses: Normal pulses.  Pulmonary:     Effort: No respiratory distress.     Breath sounds: No rales.  Abdominal:     General: Abdomen is flat. Bowel sounds are normal. There is no distension.     Tenderness: There is no abdominal tenderness.  Musculoskeletal:         General: Normal range of motion.     Cervical back: Normal range of motion.  Skin:    Capillary Refill: Capillary refill takes less than 2 seconds.  Neurological:     Mental Status: He is alert.     Coordination: Coordination abnormal.     Comments: Resting pill rolling tremor hands, cogwheel ridigity, slow shuffling gait, mask ike face  Psychiatric:        Mood and Affect: Mood normal.        Thought Content: Thought content normal.       Lab Results  Component Value Date   WBC 10.0 08/08/2020   HGB 13.4 08/08/2020   HCT 39.4 08/08/2020   PLT 223 08/08/2020   GLUCOSE 97 08/08/2020   CHOL 163 08/08/2020   TRIG 107 08/08/2020   HDL 52 08/08/2020   LDLCALC 92 08/08/2020   ALT 16 08/08/2020   AST 21 08/08/2020   NA 140 08/08/2020   K 4.0 08/08/2020   CL 99 08/08/2020   CREATININE 1.43 (H) 08/08/2020   BUN 29 (H) 08/08/2020   CO2 24 08/08/2020   TSH 3.874 03/06/2017   INR 1.15 03/06/2017   HGBA1C 4.5 (L) 03/08/2017      Assessment & Plan:  Diagnoses and all orders for this visit: Essential hypertension, benign An individual hypertension care plan was established and reinforced today.  The patient's status was assessed using clinical findings on exam and labs or diagnostic tests. The patient's success at meeting treatment goals on disease specific evidence-based guidelines and found to be well controlled. SELF MANAGEMENT: The patient and I together assessed ways to personally work towards obtaining the recommended goals. RECOMMENDATIONS: avoid  decongestants found in common cold remedies, decrease consumption of alcohol, perform routine monitoring of BP with home BP cuff, exercise, reduction of dietary salt, take medicines as prescribed, try not to miss doses and quit smoking.  Regular exercise and maintaining a healthy weight is needed.  Stress reduction may help. A CLINICAL SUMMARY including written plan identify barriers to care unique to individual due to social or  financial issues.  We attempt to mutually creat solutions for individual and family understanding.  Other schizophrenia (HCC) AN INDIVIDUAL CARE PLAN for schizophrenia was established and reinforced today.  The patient's status was assessed using clinical findings on exam, labs, and other diagnostic testing. Patient's success at meeting treatment goals based on disease specific evidence-bassed guidelines and found to be in fair control. RECOMMENDATIONS include maintaining present medicines and treatment. He sees Psychiatrist  Mixed hyperlipidemia AN INDIVIDUAL CwellARE PLAN for hyperlipidemia/ cholesterol was established and reinforced today.  The patient's status was assessed using clinical findings on exam, lab and other diagnostic tests. The patient's disease status was assessed based on evidence-based guidelines and found to be well controlled. MEDICATIONS were reviewed. SELF MANAGEMENT GOALS have been discussed and patient's success at attaining the goal of low cholesterol was assessed. RECOMMENDATION given include regular exercise 3 days a week and low cholesterol/low fat diet. CLINICAL SUMMARY including written plan to identify barriers unique to the patient due to social or economic  reasons was discussed.  Secondary parkinsonism, unspecified secondary Parkinsonism type (HCC) AN INDIVIDUAL CARE PLAN for secondary partkinsons was established and reinforced today.  The patient's status was assessed using clinical findings on exam, labs, and other diagnostic testing. Patient's success at meeting treatment goals based on disease specific evidence-bassed guidelines and found to be in fair control. RECOMMENDATIONS include maintaining present medicines and treatment. He is on chronic neuroleptics  Nephrogenic diabetes insipidus (HCC) AN INDIVIDUAL CARE PLAN nephogenic DIwas established and reinforced today.  The patient's status was assessed using clinical findings on exam, labs, and other  diagnostic testing. Patient's success at meeting treatment goals based on disease specific evidence-bassed guidelines and found to be in good control. RECOMMENDATIONS include maintaining present medicines and treatment.  Benign prostatic hyperplasia without lower urinary tract symptoms -     PSA AN INDIVIDUAL CARE PLAN For BPH was established and reinforced today.  The patient's status was assessed using clinical findings on exam, labs, and other diagnostic testing. Patient's success at meeting treatment goals based on disease specific evidence-bassed guidelines and found to be in good control. RECOMMENDATIONS include maintaining present medicines and treatment.  Chronic hypoxemic respiratory failure (HCC) Patient is using O2 nocturnally for low O2  Hypertensive heart disease without heart failure -     CBC with Differential/Platelet -     Comprehensive metabolic panel -     Lipid panel An individual hypertension care plan was established and reinforced today.  The patient's status was assessed using clinical findings on exam and labs or diagnostic tests. The patient's success at meeting treatment goals on disease specific evidence-based guidelines and found to be fair controlled. SELF MANAGEMENT: The patient and I together assessed ways to personally work towards obtaining the recommended goals. RECOMMENDATIONS: avoid decongestants found in common cold remedies, decrease consumption of alcohol, perform routine monitoring of BP with home BP cuff, exercise, reduction of dietary salt, take medicines as prescribed, try not to miss doses and quit smoking.  Regular exercise and maintaining a healthy weight is needed.  Stress reduction may help. A CLINICAL SUMMARY  including written plan identify barriers to care unique to individual due to social or financial issues.  We attempt to mutually creat solutions for individual and family understanding.  Enlarged thoracic aorta [facility]) Seeing cardiology for  this, we may have to perform ultrasound.  Paroxysmal atrial fibrillation [facility]) Patient has a diagnosis of paroxysmal atrial fibrillation.   Patient is on none and has controlled ventricular response.  Patient is CV stable . Simple schizophrenia, subchronic condition (HCC) -     Valproic acid level AN INDIVIDUAL CARE PLAN for schizophrenia was established and reinforced today.  The patient's status was assessed using clinical findings on exam, labs, and other diagnostic testing. Patient's success at meeting treatment goals based on disease specific evidence-bassed guidelines and found to be in fair control. RECOMMENDATIONS include maintaining present medicines and treatment.   Orders Placed This Encounter  Procedures  . CBC with Differential/Platelet  . Comprehensive metabolic panel  . Lipid panel  . PSA  . Valproic acid level      I spent 35 minutes dedicated to the care of this patient on the date of this encounter to include face-to-face time with the patient, as well as:   Follow-up: Return in about 6 months (around 05/23/2021) for fasting.  An After Visit Summary was printed and given to the patient.  Brent Bulla, MD [facility] [phone]

## 2020-11-21 LAB — CBC WITH DIFFERENTIAL/PLATELET
Basophils Absolute: 0.1 10*3/uL (ref 0.0–0.2)
Basos: 1 %
EOS (ABSOLUTE): 0.1 10*3/uL (ref 0.0–0.4)
Eos: 2 %
Hematocrit: 36.7 % — ABNORMAL LOW (ref 37.5–51.0)
Hemoglobin: 12.3 g/dL — ABNORMAL LOW (ref 13.0–17.7)
Immature Grans (Abs): 0 10*3/uL (ref 0.0–0.1)
Immature Granulocytes: 0 %
Lymphocytes Absolute: 2.1 10*3/uL (ref 0.7–3.1)
Lymphs: 35 %
MCH: 31.3 pg (ref 26.6–33.0)
MCHC: 33.5 g/dL (ref 31.5–35.7)
MCV: 93 fL (ref 79–97)
Monocytes Absolute: 0.7 10*3/uL (ref 0.1–0.9)
Monocytes: 11 %
Neutrophils Absolute: 3.1 10*3/uL (ref 1.4–7.0)
Neutrophils: 51 %
Platelets: 239 10*3/uL (ref 150–450)
RBC: 3.93 x10E6/uL — ABNORMAL LOW (ref 4.14–5.80)
RDW: 13.4 % (ref 11.6–15.4)
WBC: 6.1 10*3/uL (ref 3.4–10.8)

## 2020-11-21 LAB — COMPREHENSIVE METABOLIC PANEL
ALT: 16 IU/L (ref 0–44)
AST: 18 IU/L (ref 0–40)
Albumin/Globulin Ratio: 2.1 (ref 1.2–2.2)
Albumin: 4.1 g/dL (ref 3.8–4.9)
Alkaline Phosphatase: 40 IU/L — ABNORMAL LOW (ref 44–121)
BUN/Creatinine Ratio: 20 (ref 9–20)
BUN: 24 mg/dL (ref 6–24)
Bilirubin Total: 0.2 mg/dL (ref 0.0–1.2)
CO2: 25 mmol/L (ref 20–29)
Calcium: 8.8 mg/dL (ref 8.7–10.2)
Chloride: 95 mmol/L — ABNORMAL LOW (ref 96–106)
Creatinine, Ser: 1.22 mg/dL (ref 0.76–1.27)
Globulin, Total: 2 g/dL (ref 1.5–4.5)
Glucose: 71 mg/dL (ref 65–99)
Potassium: 3.6 mmol/L (ref 3.5–5.2)
Sodium: 136 mmol/L (ref 134–144)
Total Protein: 6.1 g/dL (ref 6.0–8.5)
eGFR: 68 mL/min/{1.73_m2} (ref >59)

## 2020-11-21 LAB — VALPROIC ACID LEVEL: Valproic Acid Lvl: 62 ug/mL (ref 50–100)

## 2020-11-21 LAB — LIPID PANEL
Chol/HDL Ratio: 3.4 ratio (ref 0.0–5.0)
Cholesterol, Total: 152 mg/dL (ref 100–199)
HDL: 45 mg/dL (ref >39)
LDL Chol Calc (NIH): 90 mg/dL (ref 0–99)
Triglycerides: 91 mg/dL (ref 0–149)
VLDL Cholesterol Cal: 17 mg/dL (ref 5–40)

## 2020-11-21 LAB — PSA: Prostate Specific Ag, Serum: 0.1 ng/mL (ref 0.0–4.0)

## 2020-11-21 LAB — CARDIOVASCULAR RISK ASSESSMENT

## 2020-12-06 ENCOUNTER — Telehealth: Payer: Self-pay

## 2020-12-06 NOTE — Telephone Encounter (Signed)
Dondra Spry, Home Health Nurse left VM requesting verbal orders to reverify pt for weekly skilled nursing visits.   Returned call. Gave verbal orders for weekly skilled nursing visits.  Lorita Officer, CCMA 12/06/20 8:01 AM

## 2020-12-15 DIAGNOSIS — F4310 Post-traumatic stress disorder, unspecified: Secondary | ICD-10-CM | POA: Insufficient documentation

## 2020-12-15 DIAGNOSIS — M810 Age-related osteoporosis without current pathological fracture: Secondary | ICD-10-CM | POA: Insufficient documentation

## 2020-12-15 DIAGNOSIS — N400 Enlarged prostate without lower urinary tract symptoms: Secondary | ICD-10-CM | POA: Insufficient documentation

## 2020-12-15 DIAGNOSIS — G4700 Insomnia, unspecified: Secondary | ICD-10-CM | POA: Insufficient documentation

## 2020-12-28 ENCOUNTER — Ambulatory Visit: Payer: Medicare Other | Admitting: Cardiology

## 2020-12-28 DIAGNOSIS — N400 Enlarged prostate without lower urinary tract symptoms: Secondary | ICD-10-CM

## 2020-12-28 DIAGNOSIS — F4310 Post-traumatic stress disorder, unspecified: Secondary | ICD-10-CM

## 2021-02-20 ENCOUNTER — Ambulatory Visit: Payer: Medicare Other | Admitting: Legal Medicine

## 2021-03-08 NOTE — Progress Notes (Deleted)
Cardiology Office Note:    Date:  03/08/2021   ID:  William [name], DOB 1961-02-14, MRN [id number]  PCP:  Abigail Miyamoto, MD  Cardiologist:  Norman Herrlich, MD    Referring MD: Abigail Miyamoto,*    ASSESSMENT:    No diagnosis found. PLAN:    In order of problems listed above:  ***   Next appointment: ***   Medication Adjustments/Labs and Tests Ordered: Current medicines are reviewed at length with the patient today.  Concerns regarding medicines are outlined above.  No orders of the defined types were placed in this encounter.  No orders of the defined types were placed in this encounter.   No chief complaint on file.   History of Present Illness:    William [name] is a 60 y.o. 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  He is purposely not anticoagulated because of unsteady gait and falls.Echocardiogram 03/08/2017 showed mild concentric left ventricular remodeling normal systolic function ejection fraction 55 to 60%. There is also mild aortic regurgitation mild enlargement of the aortic root and the left and right atrium both normal in size.  Compliance with diet, lifestyle and medications: *** Past Medical History:  Diagnosis Date   BPH (benign prostatic hyperplasia)    Insomnia    Osteoporosis    Prostate hypertrophy    PTSD (post-traumatic stress disorder)     Past Surgical History:  Procedure Laterality Date   TONSILLECTOMY AND ADENOIDECTOMY     as child   TOOTH EXTRACTION  10/22/2011   Procedure: DENTAL RESTORATION/EXTRACTIONS;  Surgeon: Georgia Lopes, DDS;  Location: [facility] OR;  Service: Oral Surgery;  Laterality: Bilateral;   TRANSURETHRAL RESECTION OF PROSTATE     x 2    Current Medications: No outpatient medications have been marked as taking for the 03/09/21 encounter (Appointment) with  Baldo Daub, MD.     Allergies:   Bee venom, Latex, Lithium, Penicillin g, Penicillins cross reactors, Sulfa antibiotics, and Sulfa drugs cross reactors   Social History   Socioeconomic History   Marital status: Single    Spouse name: Not on file   Number of children: Not on file   Years of education: Not on file   Highest education level: Not on file  Occupational History   Occupation: disabled  Tobacco Use   Smoking status: Never   Smokeless tobacco: Never  Vaping Use   Vaping Use: Never used  Substance and Sexual Activity   Alcohol use: No   Drug use: No   Sexual activity: Not Currently  Other Topics Concern   Not on file  Social History Narrative   Single, disabled, lives in home with full time caregiver- [name]   Right handed   caffeine use - none   Social Determinants of Health   Financial Resource Strain: Not on file  Food Insecurity: Not on file  Transportation Needs: Not on file  Physical Activity: Not on file  Stress: Not on file  Social Connections: Not on file     Family History: The patient's ***family history includes Dementia in his mother; Diabetes in his father and mother; Heart disease in his father and mother. ROS:   Please see the history of present illness.    All other systems reviewed and are negative.  EKGs/Labs/Other Studies Reviewed:    The following studies were reviewed today:  EKG:  EKG ordered today and personally reviewed.  The ekg ordered today demonstrates ***  Recent Labs: 11/20/2020: ALT 16; BUN 24; Creatinine, Ser 1.22; Hemoglobin 12.3; Platelets 239; Potassium 3.6; Sodium 136  Recent Lipid Panel    Component Value Date/Time   CHOL 152 11/20/2020 0935   TRIG 91 11/20/2020 0935   HDL 45 11/20/2020 0935   CHOLHDL 3.4 11/20/2020 0935   LDLCALC 90 11/20/2020 0935    Physical Exam:    VS:  There were no vitals taken for this visit.    Wt Readings from Last 3 Encounters:  11/20/20 169 lb (76.7 kg)  08/08/20 168 lb  6.4 oz (76.4 kg)  05/04/20 185 lb (83.9 kg)     GEN: *** Well nourished, well developed in no acute distress HEENT: Normal NECK: No JVD; No carotid bruits LYMPHATICS: No lymphadenopathy CARDIAC: ***RRR, no murmurs, rubs, gallops RESPIRATORY:  Clear to auscultation without rales, wheezing or rhonchi  ABDOMEN: Soft, non-tender, non-distended MUSCULOSKELETAL:  No edema; No deformity  SKIN: Warm and dry NEUROLOGIC:  Alert and oriented x 3 PSYCHIATRIC:  Normal affect    Signed, Norman Herrlich, MD  03/08/2021 3:06 PM    [facility]

## 2021-03-09 ENCOUNTER — Ambulatory Visit: Payer: Medicare Other | Admitting: Cardiology

## 2021-04-05 DIAGNOSIS — F2089 Other schizophrenia: Secondary | ICD-10-CM | POA: Diagnosis not present

## 2021-04-05 DIAGNOSIS — G2119 Other drug induced secondary parkinsonism: Secondary | ICD-10-CM | POA: Diagnosis not present

## 2021-04-05 DIAGNOSIS — E782 Mixed hyperlipidemia: Secondary | ICD-10-CM | POA: Diagnosis not present

## 2021-04-18 ENCOUNTER — Ambulatory Visit: Payer: Medicare Other | Admitting: Cardiology

## 2021-04-18 NOTE — Progress Notes (Deleted)
Cardiology Office Note:    Date:  04/18/2021   ID:  William [name], DOB 08-07-1961, MRN [id number]  PCP:  Abigail Miyamoto, MD  Cardiologist:  Norman Herrlich, MD    Referring MD: Abigail Miyamoto,*    ASSESSMENT:    No diagnosis found. PLAN:    In order of problems listed above:  ***   Next appointment: ***   Medication Adjustments/Labs and Tests Ordered: Current medicines are reviewed at length with the patient today.  Concerns regarding medicines are outlined above.  No orders of the defined types were placed in this encounter.  No orders of the defined types were placed in this encounter.   No chief complaint on file.   History of Present Illness:    William [name] is a 60 y.o. 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  He was noted during hospitalization to have atrial fibrillation and was last seen 03/25/2017.Marland Kitchen  Review hospital records 03/06/2017 showed telemetry with atrial fibrillation heart rate in the range of 90 bpm and felt not to be a candidate for anticoagulation because of frequent falls.  He was last seen 09/28/2019. Compliance with diet, lifestyle and medications: ***  Echocardiogram 03/08/2017 showed mild concentric left ventricular remodeling normal systolic function ejection fraction 55 to 60%.  There is also mild aortic regurgitation mild enlargement of the aortic root and the left and right atrium both normal in size.  Past Medical History:  Diagnosis Date   BPH (benign prostatic hyperplasia)    Insomnia    Osteoporosis    Prostate hypertrophy    PTSD (post-traumatic stress disorder)     Past Surgical History:  Procedure Laterality Date   TONSILLECTOMY AND ADENOIDECTOMY     as child   TOOTH EXTRACTION  10/22/2011   Procedure: DENTAL RESTORATION/EXTRACTIONS;  Surgeon: Georgia Lopes, DDS;  Location: [facility] OR;  Service: Oral Surgery;  Laterality: Bilateral;   TRANSURETHRAL RESECTION OF PROSTATE      x 2    Current Medications: No outpatient medications have been marked as taking for the 04/18/21 encounter (Appointment) with Baldo Daub, MD.     Allergies:   Bee venom, Latex, Lithium, Penicillin g, Penicillins cross reactors, Sulfa antibiotics, and Sulfa drugs cross reactors   Social History   Socioeconomic History   Marital status: Single    Spouse name: Not on file   Number of children: Not on file   Years of education: Not on file   Highest education level: Not on file  Occupational History   Occupation: disabled  Tobacco Use   Smoking status: Never   Smokeless tobacco: Never  Vaping Use   Vaping Use: Never used  Substance and Sexual Activity   Alcohol use: No   Drug use: No   Sexual activity: Not Currently  Other Topics Concern   Not on file  Social History Narrative   Single, disabled, lives in home with full time caregiver- [name]   Right handed   caffeine use - none   Social Determinants of Health   Financial Resource Strain: Not on file  Food Insecurity: Not on file  Transportation Needs: Not on file  Physical Activity: Not on file  Stress: Not on file  Social Connections: Not on file     Family History: The patient's ***family history includes Dementia in his mother; Diabetes in his father and mother; Heart disease in his father and mother. ROS:   Please see  the history of present illness.    All other systems reviewed and are negative.  EKGs/Labs/Other Studies Reviewed:    The following studies were reviewed today:  EKG:  EKG ordered today and personally reviewed.  The ekg ordered today demonstrates ***  Recent Labs: 11/20/2020: ALT 16; BUN 24; Creatinine, Ser 1.22; Hemoglobin 12.3; Platelets 239; Potassium 3.6; Sodium 136  Recent Lipid Panel    Component Value Date/Time   CHOL 152 11/20/2020 0935   TRIG 91 11/20/2020 0935   HDL 45 11/20/2020 0935   CHOLHDL 3.4 11/20/2020 0935   LDLCALC 90 11/20/2020 0935    Physical Exam:     VS:  There were no vitals taken for this visit.    Wt Readings from Last 3 Encounters:  11/20/20 169 lb (76.7 kg)  08/08/20 168 lb 6.4 oz (76.4 kg)  05/04/20 185 lb (83.9 kg)     GEN: *** Well nourished, well developed in no acute distress HEENT: Normal NECK: No JVD; No carotid bruits LYMPHATICS: No lymphadenopathy CARDIAC: ***RRR, no murmurs, rubs, gallops RESPIRATORY:  Clear to auscultation without rales, wheezing or rhonchi  ABDOMEN: Soft, non-tender, non-distended MUSCULOSKELETAL:  No edema; No deformity  SKIN: Warm and dry NEUROLOGIC:  Alert and oriented x 3 PSYCHIATRIC:  Normal affect    Signed, Norman Herrlich, MD  04/18/2021 12:39 PM    [facility]

## 2021-05-22 ENCOUNTER — Telehealth: Payer: Self-pay

## 2021-05-22 NOTE — Telephone Encounter (Signed)
Caretaker called stating patient fell and hit his head, did not lose consciousness. When asked if patient was c/o headache, dizziness, vision changes she stated "that is his baseline". She also reported that patient has a knot on the back of his head. Spoke with Dr. Marina Goodell informing him of this info, he asked if patient was acting differently. Per the caregiver patient was not acting differently. Per Dr. Marina Goodell patient can be seen tomorrow. If he is to start acting differently he will need to go to the ER. Caregiver informed of this. Appt scheduled for tomorrow.

## 2021-05-23 ENCOUNTER — Ambulatory Visit (INDEPENDENT_AMBULATORY_CARE_PROVIDER_SITE_OTHER): Payer: Medicare Other | Admitting: Legal Medicine

## 2021-05-23 ENCOUNTER — Encounter: Payer: Self-pay | Admitting: Legal Medicine

## 2021-05-23 ENCOUNTER — Other Ambulatory Visit: Payer: Self-pay

## 2021-05-23 VITALS — BP 132/84 | HR 64 | Temp 97.2°F | Ht 67.0 in | Wt 165.8 lb

## 2021-05-23 DIAGNOSIS — R0789 Other chest pain: Secondary | ICD-10-CM

## 2021-05-23 DIAGNOSIS — W19XXXA Unspecified fall, initial encounter: Secondary | ICD-10-CM

## 2021-05-23 DIAGNOSIS — I483 Typical atrial flutter: Secondary | ICD-10-CM

## 2021-05-23 DIAGNOSIS — I4892 Unspecified atrial flutter: Secondary | ICD-10-CM | POA: Insufficient documentation

## 2021-05-23 NOTE — Progress Notes (Signed)
Acute Office Visit  Subjective:    Patient ID: William [name], male    DOB: [birth year], 60 y.o.   MRN: [id number]  Chief Complaint  Patient presents with   Fall    Fall Pertinent negatives include no abdominal pain, fever, headaches, nausea, numbness or vomiting.  Patient is in today for for a fall that happened yesterday. Paient has a knot of his head. Patient was leaning on wal and slid down yesterday.  He hit head but LOC.  He complains about left foot- he did not have shoes on.  He knocked breath out of him.  He can walk and is stable with his parkinson and he is eating and taking medicines well.  Past Medical History:  Diagnosis Date   BPH (benign prostatic hyperplasia)    Insomnia    Osteoporosis    Prostate hypertrophy    PTSD (post-traumatic stress disorder)     Past Surgical History:  Procedure Laterality Date   TONSILLECTOMY AND ADENOIDECTOMY     as child   TOOTH EXTRACTION  10/22/2011   Procedure: DENTAL RESTORATION/EXTRACTIONS;  Surgeon: Gae Bon, DDS;  Location: [facility];  Service: Oral Surgery;  Laterality: Bilateral;   TRANSURETHRAL RESECTION OF PROSTATE     x 2    Family History  Problem Relation Age of Onset   Diabetes Father    Heart disease Father    Diabetes Mother    Heart disease Mother    Dementia Mother     Social History   Socioeconomic History   Marital status: Single    Spouse name: Not on file   Number of children: Not on file   Years of education: Not on file   Highest education level: Not on file  Occupational History   Occupation: disabled  Tobacco Use   Smoking status: Never   Smokeless tobacco: Never  Vaping Use   Vaping Use: Never used  Substance and Sexual Activity   Alcohol use: No   Drug use: No   Sexual activity: Not Currently  Other Topics Concern   Not on file  Social History Narrative   Single, disabled, lives in home with full time caregiver- [name]   Right handed   caffeine use - none   Social  Determinants of Health   Financial Resource Strain: Not on file  Food Insecurity: Not on file  Transportation Needs: Not on file  Physical Activity: Not on file  Stress: Not on file  Social Connections: Not on file  Intimate Partner Violence: Not on file    Outpatient Medications Prior to Visit  Medication Sig Dispense Refill   acetaminophen (TYLENOL) 325 MG tablet Take 650 mg by mouth every 6 (six) hours as needed for moderate pain.     albuterol (PROVENTIL) (2.5 MG/3ML) 0.083% nebulizer solution 1 vial by Other route 4 (four) times a day     alendronate (FOSAMAX) 70 MG tablet Take 70 mg by mouth once a week.      aspirin EC 81 MG tablet Take 81 mg by mouth daily.     azelastine (ASTELIN) 0.1 % nasal spray Place 1 spray into both nostrils 2 (two) times daily as needed for rhinitis or allergies. Use in each nostril as directed     Benzocaine-Menthol 15-3.6 MG LOZG Use as directed 1 lozenge in the mouth or throat every 2 (two) hours as needed (sore throat).     bismuth subsalicylate (PEPTO BISMOL) 262 MG/15ML suspension Take 30 mLs by mouth  every 6 (six) hours as needed.     busPIRone (BUSPAR) 10 MG tablet      Calcium Carb-Cholecalciferol (CALCIUM-VITAMIN D) 600-400 MG-UNIT TABS Take 2 tablets by mouth daily.     carbidopa-levodopa (SINEMET IR) 25-100 MG per tablet Take 1 tablet by mouth 3 (three) times daily.      clonazePAM (KLONOPIN) 0.5 MG tablet Take 1 tablet (0.5 mg total) by mouth 4 (four) times daily as needed for anxiety (agitation, sleep). 5 tablet 0   Dentifrices (SENSODYNE DT) Place onto teeth. Brush teeth twice a day.     ENSURE PLUS (ENSURE PLUS) LIQD Take 237 mLs by mouth 2 (two) times daily between meals.     EPINEPHrine 0.3 mg/0.3 mL IJ SOAJ injection Inject 0.3 mg into the muscle as needed for anaphylaxis. 2 each 1   escitalopram (LEXAPRO) 5 MG tablet Take 5 mg by mouth daily.     ferrous sulfate 325 (65 FE) MG tablet Take by mouth.     fexofenadine (ALLEGRA) 180 MG  tablet Take 180 mg by mouth daily as needed for allergies or rhinitis.     guaifenesin (ROBITUSSIN) 100 MG/5ML syrup Take 200 mg by mouth 3 (three) times daily as needed for cough.     hydrochlorothiazide (HYDRODIURIL) 25 MG tablet TAKE 1 TABLET BY MOUTH TWICE A DAY 56 tablet 10   ibuprofen (ADVIL,MOTRIN) 200 MG tablet Take 400 mg by mouth every 8 (eight) hours as needed for moderate pain.     INGREZZA 80 MG CAPS Take 1 capsule by mouth daily.     magnesium hydroxide (MILK OF MAGNESIA) 400 MG/5ML suspension Take 30 mLs by mouth daily as needed for mild constipation.     medroxyPROGESTERone (DEPO-PROVERA) 150 MG/ML injection Inject 1 mL (150 mg total) into the muscle once a week. On fridays 1 mL 11   OLANZapine zydis (ZYPREXA) 5 MG disintegrating tablet Take 5 mg by mouth 2 (two) times daily as needed (agitation).     omeprazole (PRILOSEC) 40 MG capsule Take 40 mg by mouth daily.     phenazopyridine (PYRIDIUM) 95 MG tablet Take by mouth.     polyethylene glycol (MIRALAX / GLYCOLAX) packet Take 17 g by mouth daily. 14 each 0   potassium chloride (KLOR-CON) 10 MEQ tablet Take by mouth.     pseudoephedrine (SUDAFED) 30 MG tablet Take 30-60 mg by mouth every 4 (four) hours as needed for congestion.     Selenium Sulfide (SELSUN BLUE EX) Apply 1 application topically daily. Shampoo once a day     senna-docusate (SENOKOT-S) 8.6-50 MG tablet Take 1 tablet by mouth at bedtime. 30 tablet 0   simvastatin (ZOCOR) 10 MG tablet Take 10 mg by mouth at bedtime.     tamsulosin (FLOMAX) 0.4 MG CAPS capsule Take 0.4 mg by mouth daily.     valproic acid (DEPAKENE) 250 MG/5ML solution Take by mouth.     Wheat Dextrin (BENEFIBER PO) Take 15 mLs by mouth daily as needed (constipation).     No facility-administered medications prior to visit.    Allergies  Allergen Reactions   Bee Venom Anaphylaxis and Nausea And Vomiting   Latex Hives and Other (See Comments)    ask ask    Lithium Other (See Comments)     DM DM   Penicillin G Hives, Other (See Comments) and Rash    ask ask    Penicillins Cross Reactors Hives and Rash   Sulfa Antibiotics Hives, Other (See Comments) and Rash  ask Ask  Ask   Sulfa Drugs Cross Reactors Hives and Rash    Review of Systems  Constitutional:  Negative for chills, fatigue, fever and unexpected weight change.  HENT:  Negative for congestion, rhinorrhea, sinus pressure, sneezing and sore throat.   Eyes:  Negative for discharge and visual disturbance.  Respiratory:  Negative for cough, shortness of breath and wheezing.   Cardiovascular:  Negative for chest pain and palpitations.  Gastrointestinal:  Negative for abdominal pain, diarrhea, nausea and vomiting.  Endocrine: Negative for polydipsia, polyphagia and polyuria.  Genitourinary:  Negative for decreased urine volume, difficulty urinating, dysuria, frequency, penile swelling and urgency.  Musculoskeletal:  Negative for back pain, gait problem, joint swelling, neck pain and neck stiffness.  Neurological:  Negative for dizziness, seizures, weakness, numbness and headaches.  Psychiatric/Behavioral:  Negative for confusion, hallucinations, sleep disturbance and suicidal ideas. The patient is not nervous/anxious and is not hyperactive.       Objective:    Physical Exam  BP 132/84   Pulse 64   Temp (!) 97.2 F (36.2 C)   Ht '5\' 7"'  (1.702 m)   Wt 165 lb 12.8 oz (75.2 kg)   BMI 25.97 kg/m  Wt Readings from Last 3 Encounters:  05/23/21 165 lb 12.8 oz (75.2 kg)  11/20/20 169 lb (76.7 kg)  08/08/20 168 lb 6.4 oz (76.4 kg)    Health Maintenance Due  Topic Date Due   COVID-19 Vaccine (1) Never done   FOOT EXAM  Never done   OPHTHALMOLOGY EXAM  Never done   URINE MICROALBUMIN  Never done   Hepatitis C Screening  Never done   TETANUS/TDAP  Never done   Zoster Vaccines- Shingrix (1 of 2) Never done   COLONOSCOPY (Pts 45-79yr Insurance coverage will need to be confirmed)  Never done   HEMOGLOBIN A1C   09/07/2017   Pneumococcal Vaccine 016631Years old (2 - PCV) 08/27/2019   INFLUENZA VACCINE  Never done    There are no preventive care reminders to display for this patient.   Lab Results  Component Value Date   TSH 3.874 03/06/2017   Lab Results  Component Value Date   WBC 6.1 11/20/2020   HGB 12.3 (L) 11/20/2020   HCT 36.7 (L) 11/20/2020   MCV 93 11/20/2020   PLT 239 11/20/2020   Lab Results  Component Value Date   NA 136 11/20/2020   K 3.6 11/20/2020   CO2 25 11/20/2020   GLUCOSE 71 11/20/2020   BUN 24 11/20/2020   CREATININE 1.22 11/20/2020   BILITOT 0.2 11/20/2020   ALKPHOS 40 (L) 11/20/2020   AST 18 11/20/2020   ALT 16 11/20/2020   PROT 6.1 11/20/2020   ALBUMIN 4.1 11/20/2020   CALCIUM 8.8 11/20/2020   ANIONGAP 7 03/10/2017   EGFR 68 11/20/2020   Lab Results  Component Value Date   CHOL 152 11/20/2020   Lab Results  Component Value Date   HDL 45 11/20/2020   Lab Results  Component Value Date   LDLCALC 90 11/20/2020   Lab Results  Component Value Date   TRIG 91 11/20/2020   Lab Results  Component Value Date   CHOLHDL 3.4 11/20/2020   Lab Results  Component Value Date   HGBA1C 4.5 (L) 03/08/2017       Assessment & Plan:  1. Fall, initial encounter - DG Chest 2 View - DG Foot Complete Left Patient fell yesterday no evidence of trauma  2. Other chest pain - EKG  12-Lead Patient had some chest discomfort, ekg shows new atrial flutter  3. Typical atrial flutter ([place]) - Ambulatory referral to Cardiology Refer to cardiology       Orders Placed This Encounter  Procedures   DG Chest 2 View   DG Foot Complete Left   Ambulatory referral to Cardiology   EKG 12-Lead     I spent 30 minutes dedicated to the care of this patient on the date of this encounter to include face-to-face time with the patient, as well as: reviewed records  Follow-up: Return in about 3 months (around 08/22/2021).  An After Visit Summary was printed and given  to the patient.  Reinaldo Meeker, MD [facility] [phone]

## 2021-05-28 ENCOUNTER — Encounter: Payer: Self-pay | Admitting: Legal Medicine

## 2021-05-28 ENCOUNTER — Ambulatory Visit (INDEPENDENT_AMBULATORY_CARE_PROVIDER_SITE_OTHER): Payer: Medicare Other | Admitting: Legal Medicine

## 2021-05-28 ENCOUNTER — Other Ambulatory Visit: Payer: Self-pay

## 2021-05-28 VITALS — BP 82/48 | HR 39 | Temp 97.6°F | Resp 18 | Ht 67.0 in | Wt 166.0 lb

## 2021-05-28 DIAGNOSIS — J9611 Chronic respiratory failure with hypoxia: Secondary | ICD-10-CM

## 2021-05-28 DIAGNOSIS — W19XXXD Unspecified fall, subsequent encounter: Secondary | ICD-10-CM

## 2021-05-28 DIAGNOSIS — I119 Hypertensive heart disease without heart failure: Secondary | ICD-10-CM

## 2021-05-28 DIAGNOSIS — E119 Type 2 diabetes mellitus without complications: Secondary | ICD-10-CM | POA: Diagnosis not present

## 2021-05-28 DIAGNOSIS — I483 Typical atrial flutter: Secondary | ICD-10-CM

## 2021-05-28 DIAGNOSIS — E782 Mixed hyperlipidemia: Secondary | ICD-10-CM

## 2021-05-28 DIAGNOSIS — G219 Secondary parkinsonism, unspecified: Secondary | ICD-10-CM

## 2021-05-28 DIAGNOSIS — F2089 Other schizophrenia: Secondary | ICD-10-CM

## 2021-05-28 DIAGNOSIS — N251 Nephrogenic diabetes insipidus: Secondary | ICD-10-CM

## 2021-05-28 NOTE — Progress Notes (Addendum)
Established Patient Office Visit  Subjective:  Patient ID: William [name], male    DOB: Jun 29, 1961  Age: 60 y.o. MRN: [id number]  CC:  Chief Complaint  Patient presents with   Fall    HPI William [name] presents for chronic visit. Patient was seen last week for fall and found to have new atrial flutter, fractured ribs and fracture left foot.  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 He remains in group home.  Caregiver gave history of patient leaning on wall and slumped down to floor with LOC.  No trauma noted. He remains in atrial flutter, 107 ventricular response BP 80/50  Patient presents for follow up of hypertension.  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  Patient was diagnosed with hypertension 2010 so has been treated for hypertension for 10 years.Patient is working on maintaining diet and exercise regimen and follows up as directed. Complication include hypotension. Hold HCTZ  He is on oxygen 2L/min PRN at home.  Patient talking about possums and rabbits with context    Past Medical History:  Diagnosis Date   BPH (benign prostatic hyperplasia)    Insomnia    Osteoporosis    Prostate hypertrophy    PTSD (post-traumatic stress disorder)     Past Surgical History:  Procedure Laterality Date   TONSILLECTOMY AND ADENOIDECTOMY     as child   TOOTH EXTRACTION  10/22/2011   Procedure: DENTAL RESTORATION/EXTRACTIONS;  Surgeon: Gae Bon, DDS;  Location: [facility];  Service: Oral Surgery;  Laterality: Bilateral;   TRANSURETHRAL RESECTION OF PROSTATE     x 2    Family History  Problem Relation Age of Onset   Diabetes Father    Heart disease Father    Diabetes Mother    Heart disease Mother    Dementia Mother     Social History   Socioeconomic History   Marital status: Single    Spouse name: Not on file   Number of children: Not on file   Years of education: Not on file   Highest education level: Not on file   Occupational History   Occupation: disabled  Tobacco Use   Smoking status: Never   Smokeless tobacco: Never  Vaping Use   Vaping Use: Never used  Substance and Sexual Activity   Alcohol use: No   Drug use: No   Sexual activity: Not Currently  Other Topics Concern   Not on file  Social History Narrative   Single, disabled, lives in home with full time caregiver- [name]   Right handed   caffeine use - none   Social Determinants of Health   Financial Resource Strain: Not on file  Food Insecurity: Not on file  Transportation Needs: Not on file  Physical Activity: Not on file  Stress: Not on file  Social Connections: Not on file  Intimate Partner Violence: Not on file    Outpatient Medications Prior to Visit  Medication Sig Dispense Refill   acetaminophen (TYLENOL) 325 MG tablet Take 650 mg by mouth every 6 (six) hours as needed for moderate pain.     albuterol (PROVENTIL) (2.5 MG/3ML) 0.083% nebulizer solution 1 vial by Other route 4 (four) times a day     alendronate (FOSAMAX) 70 MG tablet Take 70 mg by mouth once a week.      aspirin EC 81 MG tablet Take 81 mg by mouth daily.     azelastine (ASTELIN) 0.1 % nasal spray Place  1 spray into both nostrils 2 (two) times daily as needed for rhinitis or allergies. Use in each nostril as directed     Benzocaine-Menthol 15-3.6 MG LOZG Use as directed 1 lozenge in the mouth or throat every 2 (two) hours as needed (sore throat).     bismuth subsalicylate (PEPTO BISMOL) 262 MG/15ML suspension Take 30 mLs by mouth every 6 (six) hours as needed.     busPIRone (BUSPAR) 10 MG tablet      Calcium Carb-Cholecalciferol (CALCIUM-VITAMIN D) 600-400 MG-UNIT TABS Take 2 tablets by mouth daily.     carbidopa-levodopa (SINEMET IR) 25-100 MG per tablet Take 1 tablet by mouth 3 (three) times daily.      clonazePAM (KLONOPIN) 0.5 MG tablet Take 1 tablet (0.5 mg total) by mouth 4 (four) times daily as needed for anxiety (agitation, sleep). 5 tablet 0    Dentifrices (SENSODYNE DT) Place onto teeth. Brush teeth twice a day.     ENSURE PLUS (ENSURE PLUS) LIQD Take 237 mLs by mouth 2 (two) times daily between meals.     EPINEPHrine 0.3 mg/0.3 mL IJ SOAJ injection Inject 0.3 mg into the muscle as needed for anaphylaxis. 2 each 1   escitalopram (LEXAPRO) 5 MG tablet Take 5 mg by mouth daily.     ferrous sulfate 325 (65 FE) MG tablet Take by mouth.     fexofenadine (ALLEGRA) 180 MG tablet Take 180 mg by mouth daily as needed for allergies or rhinitis.     guaifenesin (ROBITUSSIN) 100 MG/5ML syrup Take 200 mg by mouth 3 (three) times daily as needed for cough.     hydrochlorothiazide (HYDRODIURIL) 25 MG tablet TAKE 1 TABLET BY MOUTH TWICE A DAY 56 tablet 10   ibuprofen (ADVIL,MOTRIN) 200 MG tablet Take 400 mg by mouth every 8 (eight) hours as needed for moderate pain.     INGREZZA 80 MG CAPS Take 1 capsule by mouth daily.     magnesium hydroxide (MILK OF MAGNESIA) 400 MG/5ML suspension Take 30 mLs by mouth daily as needed for mild constipation.     medroxyPROGESTERone (DEPO-PROVERA) 150 MG/ML injection Inject 1 mL (150 mg total) into the muscle once a week. On fridays 1 mL 11   OLANZapine zydis (ZYPREXA) 10 MG disintegrating tablet Take 10 mg by mouth at bedtime.     OLANZapine zydis (ZYPREXA) 5 MG disintegrating tablet Take 5 mg by mouth 2 (two) times daily as needed (agitation).     omeprazole (PRILOSEC) 40 MG capsule Take 40 mg by mouth daily.     phenazopyridine (PYRIDIUM) 95 MG tablet Take by mouth.     polyethylene glycol (MIRALAX / GLYCOLAX) packet Take 17 g by mouth daily. 14 each 0   potassium chloride (KLOR-CON) 10 MEQ tablet Take by mouth.     pseudoephedrine (SUDAFED) 30 MG tablet Take 30-60 mg by mouth every 4 (four) hours as needed for congestion.     Selenium Sulfide (SELSUN BLUE EX) Apply 1 application topically daily. Shampoo once a day     senna-docusate (SENOKOT-S) 8.6-50 MG tablet Take 1 tablet by mouth at bedtime. 30 tablet 0    simvastatin (ZOCOR) 10 MG tablet Take 10 mg by mouth at bedtime.     tamsulosin (FLOMAX) 0.4 MG CAPS capsule Take 0.4 mg by mouth daily.     valproic acid (DEPAKENE) 250 MG/5ML solution Take by mouth.     Wheat Dextrin (BENEFIBER PO) Take 15 mLs by mouth daily as needed (constipation).     No  facility-administered medications prior to visit.    Allergies  Allergen Reactions   Bee Venom Anaphylaxis and Nausea And Vomiting   Latex Hives and Other (See Comments)    ask ask    Lithium Other (See Comments)    DM DM   Penicillin G Hives, Other (See Comments) and Rash    ask ask    Penicillins Cross Reactors Hives and Rash   Sulfa Antibiotics Hives, Other (See Comments) and Rash    ask Ask  Ask   Sulfa Drugs Cross Reactors Hives and Rash    ROS Review of Systems  Constitutional:  Negative for chills, fatigue and fever.  HENT:  Negative for congestion, ear pain and sore throat.   Respiratory:  Negative for cough and shortness of breath.   Cardiovascular:  Negative for chest pain.  Gastrointestinal:  Negative for abdominal pain, constipation, diarrhea, nausea and vomiting.  Endocrine: Negative for polydipsia, polyphagia and polyuria.  Genitourinary:  Negative for dysuria and frequency.  Musculoskeletal:  Negative for arthralgias and myalgias.  Neurological:  Negative for dizziness and headaches.  Psychiatric/Behavioral:  Negative for dysphoric mood.        No dysphoria     Objective:    Physical Exam Vitals reviewed.  Constitutional:      General: He is not in acute distress. HENT:     Right Ear: Tympanic membrane, ear canal and external ear normal.     Left Ear: Tympanic membrane, ear canal and external ear normal.     Mouth/Throat:     Mouth: Mucous membranes are dry.  Eyes:     Extraocular Movements: Extraocular movements intact.     Conjunctiva/sclera: Conjunctivae normal.     Pupils: Pupils are equal, round, and reactive to light.  Cardiovascular:     Rate  and Rhythm: Normal rate and regular rhythm.     Pulses: Normal pulses.     Heart sounds: Normal heart sounds. No murmur heard.   No gallop.  Pulmonary:     Effort: No respiratory distress.     Breath sounds: No wheezing.  Abdominal:     General: Abdomen is flat. Bowel sounds are normal. There is no distension.     Tenderness: There is no abdominal tenderness.  Musculoskeletal:     Cervical back: Normal range of motion.  Skin:    Capillary Refill: Capillary refill takes less than 2 seconds.  Neurological:     General: No focal deficit present.     Mental Status: He is alert and oriented to person, place, and time. Mental status is at baseline.    BP (!) 82/48   Pulse (!) 39   Temp 97.6 F (36.4 C)   Resp 18   Ht '5\' 7"'  (1.702 m)   Wt 166 lb (75.3 kg)   BMI 26.00 kg/m  Wt Readings from Last 3 Encounters:  05/28/21 166 lb (75.3 kg)  05/23/21 165 lb 12.8 oz (75.2 kg)  11/20/20 169 lb (76.7 kg)     Health Maintenance Due  Topic Date Due   COVID-19 Vaccine (1) Never done   FOOT EXAM  Never done   OPHTHALMOLOGY EXAM  Never done   URINE MICROALBUMIN  Never done   Hepatitis C Screening  Never done   TETANUS/TDAP  Never done   Zoster Vaccines- Shingrix (1 of 2) Never done   COLONOSCOPY (Pts 45-14yr Insurance coverage will need to be confirmed)  Never done   HEMOGLOBIN A1C  09/07/2017   INFLUENZA VACCINE  Never done    There are no preventive care reminders to display for this patient.  Lab Results  Component Value Date   TSH 3.874 03/06/2017   Lab Results  Component Value Date   WBC 6.1 11/20/2020   HGB 12.3 (L) 11/20/2020   HCT 36.7 (L) 11/20/2020   MCV 93 11/20/2020   PLT 239 11/20/2020   Lab Results  Component Value Date   NA 136 11/20/2020   K 3.6 11/20/2020   CO2 25 11/20/2020   GLUCOSE 71 11/20/2020   BUN 24 11/20/2020   CREATININE 1.22 11/20/2020   BILITOT 0.2 11/20/2020   ALKPHOS 40 (L) 11/20/2020   AST 18 11/20/2020   ALT 16 11/20/2020   PROT  6.1 11/20/2020   ALBUMIN 4.1 11/20/2020   CALCIUM 8.8 11/20/2020   ANIONGAP 7 03/10/2017   EGFR 68 11/20/2020   Lab Results  Component Value Date   CHOL 152 11/20/2020   Lab Results  Component Value Date   HDL 45 11/20/2020   Lab Results  Component Value Date   LDLCALC 90 11/20/2020   Lab Results  Component Value Date   TRIG 91 11/20/2020   Lab Results  Component Value Date   CHOLHDL 3.4 11/20/2020   Lab Results  Component Value Date   HGBA1C 4.5 (L) 03/08/2017   Diagnoses and all orders for this visit: Fall, subsequent encounter I discussed with his caregiver about his fall from last week which showed several fractured ribs and broken bone in the foot.  He denies that there was any altercation or any pushing patient says his chest does not hurt anymore although it is difficult to understand his mumbling.  He seems to be in no distress but his blood pressure is 24-23 systolic.  Due to his Parkinson's disease we are unable to do orthostatics on him since he cannot cooperate.  Diabetes mellitus without complication ([place]) -     Hemoglobin A1c Patient is being checked for secondary diabetes mellitus from his neuroleptic medication.  Hypertensive heart disease without heart failure -     CBC with Differential/Platelet -     Comprehensive metabolic panel Patient has hypertensive heart disease but today he remains in atrial flutter with systolic blood pressure between 80 and 90 in the office.  He sent to the emergency room to get some IV fluids.  Typical atrial flutter ([place]) Patient is continuing in atrial flutter with EKG today no changes he has had this in the distant past with paroxysmal atrial fibrillation.  He has seen cardiology but we will refer him again since this is continuous and may be needed to cardiovert  Chronic hypoxemic respiratory failure ([place]) Patient has chronic hypoxemia respiratory failure and was on chronic oxygen 2 L/min for about a year but he was  poorly compliant and therefore this was stopped.  Mixed hyperlipidemia -     Lipid panel AN INDIVIDUAL CARE PLAN for hyperlipidemia/ cholesterol was established and reinforced today.  The patient's status was assessed using clinical findings on exam, lab and other diagnostic tests. The patient's disease status was assessed based on evidence-based guidelines and found to be fair controlled. MEDICATIONS were reviewed. SELF MANAGEMENT GOALS have been discussed and patient's success at attaining the goal of low cholesterol was assessed. RECOMMENDATION given include regular exercise 3 days a week and low cholesterol/low fat diet. CLINICAL SUMMARY including written plan to identify barriers unique to the patient due to social or economic  reasons was discussed.   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  But he is on Sinemet IR 25-10 3 times a day with moderate improvement.  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 We did send him to [facility] room due to low BP and new chronic flutter.  His N-proBNP 2180.  No treatment in ER, he has cardiology appointment   Assessment & Plan:  30 plus minutes with review of all old x-rays  Follow-up: Return in about 3 months (around 08/27/2021) for fasting.    Reinaldo Meeker, MD

## 2021-05-29 ENCOUNTER — Encounter: Payer: Self-pay | Admitting: Legal Medicine

## 2021-05-30 NOTE — Addendum Note (Signed)
Addended by: Tawny Asal I on: 05/30/2021 08:16 AM   Modules accepted: Orders

## 2021-06-06 ENCOUNTER — Ambulatory Visit: Payer: Medicare Other | Admitting: Legal Medicine

## 2021-06-06 DIAGNOSIS — F2089 Other schizophrenia: Secondary | ICD-10-CM | POA: Diagnosis not present

## 2021-06-06 DIAGNOSIS — I10 Essential (primary) hypertension: Secondary | ICD-10-CM | POA: Diagnosis not present

## 2021-06-06 DIAGNOSIS — G2119 Other drug induced secondary parkinsonism: Secondary | ICD-10-CM | POA: Diagnosis not present

## 2021-06-08 ENCOUNTER — Encounter: Payer: Self-pay | Admitting: Cardiology

## 2021-06-08 ENCOUNTER — Ambulatory Visit (INDEPENDENT_AMBULATORY_CARE_PROVIDER_SITE_OTHER): Payer: Medicare Other | Admitting: Cardiology

## 2021-06-08 ENCOUNTER — Other Ambulatory Visit: Payer: Self-pay

## 2021-06-08 VITALS — BP 106/60 | HR 128 | Ht 67.0 in | Wt 173.0 lb

## 2021-06-08 DIAGNOSIS — I483 Typical atrial flutter: Secondary | ICD-10-CM

## 2021-06-08 DIAGNOSIS — I480 Paroxysmal atrial fibrillation: Secondary | ICD-10-CM | POA: Diagnosis not present

## 2021-06-08 MED ORDER — METOPROLOL SUCCINATE ER 25 MG PO TB24: 12.5000 mg | ORAL_TABLET | Freq: Every day | ORAL | 3 refills | Status: AC

## 2021-06-08 MED ORDER — METOPROLOL SUCCINATE ER 25 MG PO TB24: 12.5000 mg | ORAL_TABLET | Freq: Every day | ORAL | 3 refills | Status: DC

## 2021-06-08 NOTE — Patient Instructions (Signed)
Medication Instructions:  Your physician has recommended you make the following change in your medication:  START: TOPROL XL 12.5 mg take 0.5 tablet by mouth daily  *If you need a refill on your cardiac medications before your next appointment, please call your pharmacy*   Lab Work: None If you have labs (blood work) drawn today and your tests are completely normal, you will receive your results only by: MyChart Message (if you have MyChart) OR A paper copy in the mail If you have any lab test that is abnormal or we need to change your treatment, we will call you to review the results.   Testing/Procedures: None   Follow-Up: At [facility], you and your health needs are our priority.  As part of our continuing mission to provide you with exceptional heart care, we have created designated Provider Care Teams.  These Care Teams include your primary Cardiologist (physician) and Advanced Practice Providers (APPs -  Physician Assistants and Nurse Practitioners) who all work together to provide you with the care you need, when you need it.  We recommend signing up for the patient portal called "MyChart".  Sign up information is provided on this After Visit Summary.  MyChart is used to connect with patients for Virtual Visits (Telemedicine).  Patients are able to view lab/test results, encounter notes, upcoming appointments, etc.  Non-urgent messages can be sent to your provider as well.   To learn more about what you can do with MyChart, go to [email or web address].    Your next appointment:   6 week(s)  The format for your next appointment:   In Person  Provider:   Norman Herrlich, MD   Other Instructions

## 2021-06-08 NOTE — Progress Notes (Signed)
Cardiology Office Note:    Date:  06/08/2021   ID:  [name], DOB July 25, 1961, MRN [id number]  PCP:  Abigail Miyamoto, MD  Cardiologist:  Norman Herrlich, MD   Referring MD: Abigail Miyamoto,*  ASSESSMENT:    1. Paroxysmal atrial fibrillation (HCC)   2. Typical atrial flutter (HCC)    PLAN:    In order of problems listed above:  I communicated with his caregiving staff at the home we will restart a low-dose beta-blocker Toprol-XL 12 mg daily try to moderate his heart rate to rate less than 110 bpm at this point time I be extremely careful and hesitant to consider committing him to anticoagulation with his fall and trauma and cardioversion.  I will see him back in the office in 6 weeks to assess his response.  The staff will also be checking his blood pressure and heart rates.  Next appointment 6 weeks   Medication Adjustments/Labs and Tests Ordered: Current medicines are reviewed at length with the patient today.  Concerns regarding medicines are outlined above.  Orders Placed This Encounter  Procedures   EKG 12-Lead   Meds ordered this encounter  Medications   metoprolol succinate (TOPROL XL) 25 MG 24 hr tablet    Sig: Take 0.5 tablets (12.5 mg total) by mouth daily.    Dispense:  45 tablet    Refill:  3     Chief Complaint  Patient presents with   Follow-up   Atrial Flutter    History of Present Illness:    William [name] is a 60 y.o. male with a history of hypertension hyperlipidemia enlarged thoracic aorta and schizophrenia with secondary parkinsonism who is being seen today for the evaluation of atrial flutter at the request of Abigail Miyamoto,*.  He was seen by Dr. Marina Goodell in 05/28/2021 after a fall at home with rib and left foot fracture was found to have new onset atrial flutter and has a history of severe secondary Parkinson's disease from neuroleptics used to treat his schizophrenia.  He was also hypotensive with a blood pressure of  80/50. His EKG that date independently reviewed typical atrial flutter ventricular rate in the range of 110 115 bpm with nonspecific ST abnormality.    He was seen by me 09/28/2019 with a history of paroxysmal atrial fibrillation mild aortic regurgitation hypertensive heart disease with mild LVH and mild enlargement aortic root on echocardiogram.  At that time he was in sinus rhythm.  He is felt to be a poor candidate for anticoagulation he was on low-dose aspirin.  There has been no real change in his care or quality of life associated with his atrial flutter. There is discussion of cardioversion however he have to be anticoagulated before and after and I think he is a very poor candidate at this point in time with his recent trauma. Previous echocardiogram done in 2000 showed normal left ventricular function EF 55 to 60% and mild enlargement of the aortic root he had no aortic stenosis both atria normal in size. Past Medical History:  Diagnosis Date   BPH (benign prostatic hyperplasia)    Insomnia    Osteoporosis    Prostate hypertrophy    PTSD (post-traumatic stress disorder)     Past Surgical History:  Procedure Laterality Date   TONSILLECTOMY AND ADENOIDECTOMY     as child   TOOTH EXTRACTION  10/22/2011   Procedure: DENTAL RESTORATION/EXTRACTIONS;  Surgeon: Georgia Lopes, DDS;  Location: [facility] OR;  Service:  Oral Surgery;  Laterality: Bilateral;   TRANSURETHRAL RESECTION OF PROSTATE     x 2    Current Medications: Current Meds  Medication Sig   acetaminophen (TYLENOL) 325 MG tablet Take 650 mg by mouth every 6 (six) hours as needed for moderate pain.   albuterol (ACCUNEB) 0.63 MG/3ML nebulizer solution Take 1 ampule by nebulization every 6 (six) hours as needed for wheezing.   alendronate (FOSAMAX) 70 MG tablet Take 70 mg by mouth once a week.    aspirin EC 81 MG tablet Take 81 mg by mouth daily.   azelastine (ASTELIN) 0.1 % nasal spray Place 1 spray into both nostrils 2 (two)  times daily as needed for rhinitis or allergies. Use in each nostril as directed   Benzocaine-Menthol 15-3.6 MG LOZG Use as directed 1 lozenge in the mouth or throat every 2 (two) hours as needed (sore throat).   bismuth subsalicylate (PEPTO BISMOL) 262 MG/15ML suspension Take 30 mLs by mouth every 6 (six) hours as needed.   busPIRone (BUSPAR) 10 MG tablet Take 20 mg by mouth 2 (two) times daily.   Calcium Carb-Cholecalciferol (CALCIUM-VITAMIN D) 600-400 MG-UNIT TABS Take 2 tablets by mouth daily.   carbidopa-levodopa (SINEMET IR) 25-100 MG per tablet Take 1 tablet by mouth 3 (three) times daily.    clonazePAM (KLONOPIN) 0.5 MG tablet Take 1 tablet (0.5 mg total) by mouth 4 (four) times daily as needed for anxiety (agitation, sleep).   Dentifrices (SENSODYNE DT) Place onto teeth. Brush teeth twice a day.   ENSURE PLUS (ENSURE PLUS) LIQD Take 237 mLs by mouth 2 (two) times daily between meals.   EPINEPHrine 0.3 mg/0.3 mL IJ SOAJ injection Inject 0.3 mg into the muscle as needed for anaphylaxis.   escitalopram (LEXAPRO) 5 MG tablet Take 5 mg by mouth daily.   ferrous sulfate 325 (65 FE) MG tablet Take 325 mg by mouth daily with breakfast.   fexofenadine (ALLEGRA) 180 MG tablet Take 180 mg by mouth daily as needed for allergies or rhinitis.   guaifenesin (ROBITUSSIN) 100 MG/5ML syrup Take 200 mg by mouth 3 (three) times daily as needed for cough.   hydrochlorothiazide (HYDRODIURIL) 25 MG tablet TAKE 1 TABLET BY MOUTH TWICE A DAY   ibuprofen (ADVIL,MOTRIN) 200 MG tablet Take 400 mg by mouth every 8 (eight) hours as needed for moderate pain.   INGREZZA 80 MG CAPS Take 1 capsule by mouth daily.   magnesium hydroxide (MILK OF MAGNESIA) 400 MG/5ML suspension Take 30 mLs by mouth daily as needed for mild constipation.   medroxyPROGESTERone (DEPO-PROVERA) 150 MG/ML injection Inject 1 mL (150 mg total) into the muscle once a week. On fridays   metoprolol succinate (TOPROL XL) 25 MG 24 hr tablet Take 0.5  tablets (12.5 mg total) by mouth daily.   OLANZapine zydis (ZYPREXA) 10 MG disintegrating tablet Take 10 mg by mouth at bedtime.   OLANZapine zydis (ZYPREXA) 5 MG disintegrating tablet Take 5 mg by mouth 2 (two) times daily as needed (agitation).   omeprazole (PRILOSEC) 40 MG capsule Take 40 mg by mouth daily.   phenazopyridine (PYRIDIUM) 95 MG tablet Take 95 mg by mouth 3 (three) times daily as needed for pain.   polyethylene glycol (MIRALAX / GLYCOLAX) packet Take 17 g by mouth daily.   potassium chloride (KLOR-CON) 10 MEQ tablet Take 10 mEq by mouth daily.   pseudoephedrine (SUDAFED) 30 MG tablet Take 30-60 mg by mouth every 4 (four) hours as needed for congestion.   Selenium Sulfide (  SELSUN BLUE EX) Apply 1 application topically daily. Shampoo once a day   senna-docusate (SENOKOT-S) 8.6-50 MG tablet Take 1 tablet by mouth at bedtime.   simvastatin (ZOCOR) 10 MG tablet Take 10 mg by mouth at bedtime.   tamsulosin (FLOMAX) 0.4 MG CAPS capsule Take 0.4 mg by mouth daily.   valproic acid (DEPAKENE) 250 MG/5ML solution Take 500 mg by mouth 2 (two) times daily. 500 mg in am and 750 at night   Wheat Dextrin (BENEFIBER PO) Take 15 mLs by mouth daily as needed (constipation).     Allergies:   Bee venom, Latex, Lithium, Penicillin g, Penicillins cross reactors, Sulfa antibiotics, and Sulfa drugs cross reactors   Social History   Socioeconomic History   Marital status: Single    Spouse name: Not on file   Number of children: Not on file   Years of education: Not on file   Highest education level: Not on file  Occupational History   Occupation: disabled  Tobacco Use   Smoking status: Never   Smokeless tobacco: Never  Vaping Use   Vaping Use: Never used  Substance and Sexual Activity   Alcohol use: No   Drug use: No   Sexual activity: Not Currently  Other Topics Concern   Not on file  Social History Narrative   Single, disabled, lives in home with full time caregiver- [name]   Right  handed   caffeine use - none   Social Determinants of Health   Financial Resource Strain: Not on file  Food Insecurity: Not on file  Transportation Needs: Not on file  Physical Activity: Not on file  Stress: Not on file  Social Connections: Not on file     Family History: The patient's family history includes Dementia in his mother; Diabetes in his father and mother; Heart disease in his father and mother.  ROS:   ROS Please see the history of present illness.     All other systems reviewed and are negative.  EKGs/Labs/Other Studies Reviewed:    The following studies were reviewed today:   EKG:  EKG is  ordered today.  The ekg ordered today is personally reviewed and demonstrates atrial flutter typical ventricular rate 120s beats per minute  Recent Labs: 11/20/2020: ALT 16; BUN 24; Creatinine, Ser 1.22; Hemoglobin 12.3; Platelets 239; Potassium 3.6; Sodium 136  Recent Lipid Panel    Component Value Date/Time   CHOL 152 11/20/2020 0935   TRIG 91 11/20/2020 0935   HDL 45 11/20/2020 0935   CHOLHDL 3.4 11/20/2020 0935   LDLCALC 90 11/20/2020 0935    Physical Exam:    VS:  BP 106/60 (BP Location: Right Arm, Patient Position: Sitting, Cuff Size: Normal)   Pulse (!) 128   Ht 5\' 7"  (1.702 m)   Wt 173 lb (78.5 kg)   SpO2 97%   BMI 27.10 kg/m     Wt Readings from Last 3 Encounters:  06/08/21 173 lb (78.5 kg)  05/28/21 166 lb (75.3 kg)  05/23/21 165 lb 12.8 oz (75.2 kg)    Repeat blood pressure by me 106/60 GEN: He is very difficult to examine with a profound tremor and involuntary movements HEENT: Normal NECK: No JVD; No carotid bruits LYMPHATICS: No lymphadenopathy CARDIAC: Irregular rate and rhythm no murmurs, rubs, gallops RESPIRATORY:  Clear to auscultation without rales, wheezing or rhonchi  ABDOMEN: Soft, non-tender, non-distended MUSCULOSKELETAL:  No edema; No deformity  SKIN: Warm and dry NEUROLOGIC:  Alert and oriented x 3 PSYCHIATRIC:  Normal affect      Signed, Norman Herrlich, MD  06/08/2021 3:16 PM    [facility]

## 2021-06-12 ENCOUNTER — Ambulatory Visit: Payer: Medicare Other | Admitting: Family Medicine

## 2021-06-12 ENCOUNTER — Ambulatory Visit: Payer: Medicare Other | Admitting: Legal Medicine

## 2021-06-18 ENCOUNTER — Encounter: Payer: Self-pay | Admitting: Family Medicine

## 2021-06-18 ENCOUNTER — Ambulatory Visit (INDEPENDENT_AMBULATORY_CARE_PROVIDER_SITE_OTHER): Payer: Medicare Other | Admitting: Family Medicine

## 2021-06-18 ENCOUNTER — Other Ambulatory Visit: Payer: Self-pay

## 2021-06-18 VITALS — BP 104/58 | HR 66 | Temp 97.1°F | Resp 20 | Ht 67.0 in | Wt 170.2 lb

## 2021-06-18 DIAGNOSIS — I119 Hypertensive heart disease without heart failure: Secondary | ICD-10-CM

## 2021-06-18 DIAGNOSIS — J9611 Chronic respiratory failure with hypoxia: Secondary | ICD-10-CM

## 2021-06-18 DIAGNOSIS — S2241XD Multiple fractures of ribs, right side, subsequent encounter for fracture with routine healing: Secondary | ICD-10-CM

## 2021-06-18 DIAGNOSIS — I483 Typical atrial flutter: Secondary | ICD-10-CM | POA: Diagnosis not present

## 2021-06-18 DIAGNOSIS — W19XXXD Unspecified fall, subsequent encounter: Secondary | ICD-10-CM | POA: Diagnosis not present

## 2021-06-18 DIAGNOSIS — G219 Secondary parkinsonism, unspecified: Secondary | ICD-10-CM | POA: Diagnosis not present

## 2021-06-18 DIAGNOSIS — I480 Paroxysmal atrial fibrillation: Secondary | ICD-10-CM

## 2021-06-18 DIAGNOSIS — F2089 Other schizophrenia: Secondary | ICD-10-CM

## 2021-06-18 DIAGNOSIS — N251 Nephrogenic diabetes insipidus: Secondary | ICD-10-CM

## 2021-06-18 NOTE — Progress Notes (Signed)
Subjective:  Patient ID: William [name], male    DOB: 16-Apr-1961  Age: 60 y.o. MRN: [id number]  Chief Complaint  Patient presents with   Fall    fu    Fall Pertinent negatives include no abdominal pain, fever, headaches, nausea or vomiting.  Patient presents today for follow up after fall 4 weeks ago. He has not had another fall. He still has some rib tenderness, but overall is able to function at previous level.  Pt is a 60 yo WM with mental disability, schizophrenia, and bipolar disorder, diabetes, hypertensive heart diease and secondary parkinsonism. Most recent A1C was 5.3 and this has been several months ago. His most recent lipid was well controlled. He sees cardiology for his atrialfibrillation.  Caregiver stated there were no further concerns.    Current Outpatient Medications on File Prior to Visit  Medication Sig Dispense Refill   acetaminophen (TYLENOL) 325 MG tablet Take 650 mg by mouth every 6 (six) hours as needed for moderate pain.     albuterol (ACCUNEB) 0.63 MG/3ML nebulizer solution Take 1 ampule by nebulization every 6 (six) hours as needed for wheezing.     alendronate (FOSAMAX) 70 MG tablet Take 70 mg by mouth once a week.      aspirin EC 81 MG tablet Take 81 mg by mouth daily.     azelastine (ASTELIN) 0.1 % nasal spray Place 1 spray into both nostrils 2 (two) times daily as needed for rhinitis or allergies. Use in each nostril as directed     Benzocaine-Menthol 15-3.6 MG LOZG Use as directed 1 lozenge in the mouth or throat every 2 (two) hours as needed (sore throat).     bismuth subsalicylate (PEPTO BISMOL) 262 MG/15ML suspension Take 30 mLs by mouth every 6 (six) hours as needed.     busPIRone (BUSPAR) 10 MG tablet Take 20 mg by mouth 2 (two) times daily.     Calcium Carb-Cholecalciferol (CALCIUM-VITAMIN D) 600-400 MG-UNIT TABS Take 2 tablets by mouth daily.     carbidopa-levodopa (SINEMET IR) 25-100 MG per tablet Take 1 tablet by mouth 3 (three) times daily.       clonazePAM (KLONOPIN) 0.5 MG tablet Take 1 tablet (0.5 mg total) by mouth 4 (four) times daily as needed for anxiety (agitation, sleep). 5 tablet 0   Dentifrices (SENSODYNE DT) Place onto teeth. Brush teeth twice a day.     ENSURE PLUS (ENSURE PLUS) LIQD Take 237 mLs by mouth 2 (two) times daily between meals.     EPINEPHrine 0.3 mg/0.3 mL IJ SOAJ injection Inject 0.3 mg into the muscle as needed for anaphylaxis. 2 each 1   escitalopram (LEXAPRO) 5 MG tablet Take 5 mg by mouth daily.     ferrous sulfate 325 (65 FE) MG tablet Take 325 mg by mouth daily with breakfast.     fexofenadine (ALLEGRA) 180 MG tablet Take 180 mg by mouth daily as needed for allergies or rhinitis.     guaifenesin (ROBITUSSIN) 100 MG/5ML syrup Take 200 mg by mouth 3 (three) times daily as needed for cough.     hydrochlorothiazide (HYDRODIURIL) 25 MG tablet TAKE 1 TABLET BY MOUTH TWICE A DAY 56 tablet 10   ibuprofen (ADVIL,MOTRIN) 200 MG tablet Take 400 mg by mouth every 8 (eight) hours as needed for moderate pain.     INGREZZA 80 MG CAPS Take 1 capsule by mouth daily.     magnesium hydroxide (MILK OF MAGNESIA) 400 MG/5ML suspension Take 30 mLs by mouth  daily as needed for mild constipation.     medroxyPROGESTERone (DEPO-PROVERA) 150 MG/ML injection Inject 1 mL (150 mg total) into the muscle once a week. On fridays 1 mL 11   metoprolol succinate (TOPROL XL) 25 MG 24 hr tablet Take 0.5 tablets (12.5 mg total) by mouth daily. 45 tablet 3   OLANZapine zydis (ZYPREXA) 10 MG disintegrating tablet Take 10 mg by mouth at bedtime.     OLANZapine zydis (ZYPREXA) 5 MG disintegrating tablet Take 5 mg by mouth 2 (two) times daily as needed (agitation).     omeprazole (PRILOSEC) 40 MG capsule Take 40 mg by mouth daily.     phenazopyridine (PYRIDIUM) 95 MG tablet Take 95 mg by mouth 3 (three) times daily as needed for pain.     polyethylene glycol (MIRALAX / GLYCOLAX) packet Take 17 g by mouth daily. 14 each 0   potassium chloride  (KLOR-CON) 10 MEQ tablet Take 10 mEq by mouth daily.     pseudoephedrine (SUDAFED) 30 MG tablet Take 30-60 mg by mouth every 4 (four) hours as needed for congestion.     Selenium Sulfide (SELSUN BLUE EX) Apply 1 application topically daily. Shampoo once a day     senna-docusate (SENOKOT-S) 8.6-50 MG tablet Take 1 tablet by mouth at bedtime. 30 tablet 0   simvastatin (ZOCOR) 10 MG tablet Take 10 mg by mouth at bedtime.     tamsulosin (FLOMAX) 0.4 MG CAPS capsule Take 0.4 mg by mouth daily.     valproic acid (DEPAKENE) 250 MG/5ML solution Take 500 mg by mouth 2 (two) times daily. 500 mg in am and 750 at night     Wheat Dextrin (BENEFIBER PO) Take 15 mLs by mouth daily as needed (constipation).     No current facility-administered medications on file prior to visit.   Past Medical History:  Diagnosis Date   BPH (benign prostatic hyperplasia)    Insomnia    Osteoporosis    Prostate hypertrophy    PTSD (post-traumatic stress disorder)    Past Surgical History:  Procedure Laterality Date   TONSILLECTOMY AND ADENOIDECTOMY     as child   TOOTH EXTRACTION  10/22/2011   Procedure: DENTAL RESTORATION/EXTRACTIONS;  Surgeon: Georgia Lopes, DDS;  Location: [facility] OR;  Service: Oral Surgery;  Laterality: Bilateral;   TRANSURETHRAL RESECTION OF PROSTATE     x 2    Family History  Problem Relation Age of Onset   Diabetes Father    Heart disease Father    Diabetes Mother    Heart disease Mother    Dementia Mother    Social History   Socioeconomic History   Marital status: Single    Spouse name: Not on file   Number of children: Not on file   Years of education: Not on file   Highest education level: Not on file  Occupational History   Occupation: disabled  Tobacco Use   Smoking status: Never   Smokeless tobacco: Never  Vaping Use   Vaping Use: Never used  Substance and Sexual Activity   Alcohol use: No   Drug use: No   Sexual activity: Not Currently  Other Topics Concern   Not on  file  Social History Narrative   Single, disabled, lives in home with full time caregiver- [name]   Right handed   caffeine use - none   Social Determinants of Health   Financial Resource Strain: Not on file  Food Insecurity: Not on file  Transportation Needs: Not on  file  Physical Activity: Not on file  Stress: Not on file  Social Connections: Not on file    Review of Systems  Constitutional:  Negative for appetite change, fatigue and fever.  HENT:  Negative for congestion, ear pain, sinus pressure and sore throat.   Eyes:  Negative for pain.  Respiratory:  Negative for cough, shortness of breath and wheezing.   Cardiovascular:  Negative for chest pain and palpitations.  Gastrointestinal:  Negative for abdominal pain, constipation, diarrhea, nausea and vomiting.  Genitourinary:  Negative for dysuria and frequency.  Musculoskeletal:  Negative for arthralgias, back pain, joint swelling and myalgias.  Skin:  Negative for rash.  Neurological:  Negative for dizziness, weakness and headaches.  Psychiatric/Behavioral:  Negative for dysphoric mood. The patient is not nervous/anxious.     Objective:  BP (!) 104/58   Pulse 66   Temp (!) 97.1 F (36.2 C)   Resp 20   Ht 5\' 7"  (1.702 m)   Wt 170 lb 3.2 oz (77.2 kg)   SpO2 99%   BMI 26.66 kg/m   BP/Weight 06/18/2021 06/08/2021 05/28/2021  Systolic BP 104 106 82  Diastolic BP 58 60 48  Wt. (Lbs) 170.2 173 166  BMI 26.66 27.1 26    Physical Exam Vitals reviewed.  Constitutional:      Appearance: Normal appearance.  Neck:     Vascular: No carotid bruit.  Cardiovascular:     Rate and Rhythm: Normal rate. Rhythm irregular.     Heart sounds: Normal heart sounds.  Pulmonary:     Effort: Pulmonary effort is normal.     Breath sounds: Normal breath sounds.  Abdominal:     General: Abdomen is flat. Bowel sounds are normal.     Palpations: Abdomen is soft.     Tenderness: There is no abdominal tenderness.  Neurological:      Mental Status: He is alert.     Comments: Parkinsonian tremors. Gait is abnormal.   Diabetic Foot Exam - Simple   No data filed      Lab Results  Component Value Date   WBC 6.1 11/20/2020   HGB 12.3 (L) 11/20/2020   HCT 36.7 (L) 11/20/2020   PLT 239 11/20/2020   GLUCOSE 71 11/20/2020   CHOL 152 11/20/2020   TRIG 91 11/20/2020   HDL 45 11/20/2020   LDLCALC 90 11/20/2020   ALT 16 11/20/2020   AST 18 11/20/2020   NA 136 11/20/2020   K 3.6 11/20/2020   CL 95 (L) 11/20/2020   CREATININE 1.22 11/20/2020   BUN 24 11/20/2020   CO2 25 11/20/2020   TSH 3.874 03/06/2017   INR 1.15 03/06/2017   HGBA1C 4.5 (L) 03/08/2017      Assessment & Plan:   Problem List Items Addressed This Visit       Cardiovascular and Mediastinum   Hypertensive heart disease - Primary    Managed by Dr. 03/10/2017.  Bp has improved. Last visit was too low.       Paroxysmal atrial fibrillation (HCC)    Mgmt by Dr. Dulce Sellar      Atrial flutter [facility])    Mgmt by Dr. [facility].         Respiratory   Chronic hypoxemic respiratory failure (HCC)    Has o2 at 2 L at night and prn during the day.         Endocrine   Nephrogenic diabetes insipidus (HCC)    Continue hctz. Needs A1C. I WILL HAVE PT  RETURN FOR LABS.         Nervous and Auditory   Secondary parkinsonism (HCC)    Continue sinemet.        Musculoskeletal and Integument   Multiple closed fractures of ribs of right side    Improving.        Other   Falls    Caution to prevent falls recommended.       Simple schizophrenia, subchronic condition (HCC)    Management per specialist. psychiatry      .   Follow-up: Return in about 3 months (around 09/18/2021) for chronic fasting.  An After Visit Summary was printed and given to the patient.  Blane Ohara, MD [facility] [phone]

## 2021-06-19 NOTE — Assessment & Plan Note (Signed)
Continue sinemet.

## 2021-06-19 NOTE — Assessment & Plan Note (Signed)
Mgmt by Dr. Dulce Sellar

## 2021-06-19 NOTE — Assessment & Plan Note (Signed)
Mgmt by Dr. Munley 

## 2021-06-19 NOTE — Assessment & Plan Note (Signed)
Continue hctz. Needs A1C. I WILL HAVE PT RETURN FOR LABS.

## 2021-06-19 NOTE — Assessment & Plan Note (Signed)
Has o2 at 2 L at night and prn during the day.

## 2021-06-19 NOTE — Assessment & Plan Note (Signed)
Managed by Dr. Dulce Sellar.  Bp has improved. Last visit was too low.

## 2021-07-01 DIAGNOSIS — S2241XA Multiple fractures of ribs, right side, initial encounter for closed fracture: Secondary | ICD-10-CM | POA: Insufficient documentation

## 2021-07-01 NOTE — Assessment & Plan Note (Signed)
Management per specialist. psychiatry

## 2021-07-01 NOTE — Assessment & Plan Note (Signed)
Caution to prevent falls recommended.

## 2021-07-01 NOTE — Assessment & Plan Note (Signed)
Improving.

## 2021-07-04 ENCOUNTER — Ambulatory Visit: Payer: Medicare Other | Admitting: Cardiology
